# Patient Record
Sex: Male | Born: 1950
Health system: Southern US, Community
[De-identification: ages and names within clinical notes are randomized; demographics above are authoritative.]

## PROBLEM LIST (undated history)

## (undated) DIAGNOSIS — I10 Essential (primary) hypertension: Secondary | ICD-10-CM

## (undated) DIAGNOSIS — C801 Malignant (primary) neoplasm, unspecified: Secondary | ICD-10-CM

## (undated) HISTORY — PX: NO PAST SURGERIES: SHX2092

---

## 1998-04-28 ENCOUNTER — Ambulatory Visit (HOSPITAL_COMMUNITY): Admission: RE | Admit: 1998-04-28 | Discharge: 1998-04-28 | Payer: Self-pay | Admitting: Internal Medicine

## 2000-02-10 ENCOUNTER — Encounter: Payer: Self-pay | Admitting: Internal Medicine

## 2000-02-10 ENCOUNTER — Encounter: Admission: RE | Admit: 2000-02-10 | Discharge: 2000-02-10 | Payer: Self-pay | Admitting: Internal Medicine

## 2001-07-17 ENCOUNTER — Emergency Department (HOSPITAL_COMMUNITY): Admission: EM | Admit: 2001-07-17 | Discharge: 2001-07-17 | Payer: Self-pay | Admitting: Emergency Medicine

## 2003-10-01 ENCOUNTER — Emergency Department (HOSPITAL_COMMUNITY): Admission: AD | Admit: 2003-10-01 | Discharge: 2003-10-01 | Payer: Self-pay | Admitting: Family Medicine

## 2004-08-29 ENCOUNTER — Emergency Department (HOSPITAL_COMMUNITY): Admission: EM | Admit: 2004-08-29 | Discharge: 2004-08-29 | Payer: Self-pay | Admitting: Family Medicine

## 2006-06-29 ENCOUNTER — Encounter: Admission: RE | Admit: 2006-06-29 | Discharge: 2006-06-29 | Payer: Self-pay | Admitting: Anesthesiology

## 2006-07-11 ENCOUNTER — Encounter: Admission: RE | Admit: 2006-07-11 | Discharge: 2006-10-09 | Payer: Self-pay | Admitting: Orthopedic Surgery

## 2007-09-23 ENCOUNTER — Emergency Department (HOSPITAL_COMMUNITY): Admission: EM | Admit: 2007-09-23 | Discharge: 2007-09-23 | Payer: Self-pay | Admitting: Emergency Medicine

## 2012-07-12 ENCOUNTER — Other Ambulatory Visit: Payer: Self-pay | Admitting: Urology

## 2012-08-23 ENCOUNTER — Encounter (HOSPITAL_COMMUNITY): Payer: Self-pay | Admitting: Pharmacy Technician

## 2012-08-27 ENCOUNTER — Encounter (HOSPITAL_COMMUNITY): Payer: Self-pay

## 2012-08-27 ENCOUNTER — Encounter (HOSPITAL_COMMUNITY)
Admission: RE | Admit: 2012-08-27 | Discharge: 2012-08-27 | Disposition: A | Discharge status: Home / Self Care | Payer: BC Managed Care – PPO | Source: Ambulatory Visit | Attending: Urology | Admitting: Urology

## 2012-08-27 ENCOUNTER — Ambulatory Visit (HOSPITAL_COMMUNITY)
Admission: RE | Admit: 2012-08-27 | Discharge: 2012-08-27 | Disposition: A | Discharge status: Home / Self Care | Payer: BC Managed Care – PPO | Source: Ambulatory Visit | Attending: Urology | Admitting: Urology

## 2012-08-27 DIAGNOSIS — Z01811 Encounter for preprocedural respiratory examination: Secondary | ICD-10-CM | POA: Insufficient documentation

## 2012-08-27 HISTORY — DX: Essential (primary) hypertension: I10

## 2012-08-27 HISTORY — DX: Malignant (primary) neoplasm, unspecified: C80.1

## 2012-08-27 LAB — BASIC METABOLIC PANEL
Chloride: 101 mEq/L (ref 96–112)
Creatinine, Ser: 1.03 mg/dL (ref 0.50–1.35)
GFR calc Af Amer: 89 mL/min — ABNORMAL LOW (ref >90)
GFR calc non Af Amer: 76 mL/min — ABNORMAL LOW (ref >90)
Potassium: 3.5 mEq/L (ref 3.5–5.1)

## 2012-08-27 LAB — SURGICAL PCR SCREEN: Staphylococcus aureus: NEGATIVE

## 2012-08-27 LAB — CBC
MCHC: 34.6 g/dL (ref 30.0–36.0)
Platelets: 245 10*3/uL (ref 150–400)
RDW: 13.1 % (ref 11.5–15.5)
WBC: 4.4 10*3/uL (ref 4.0–10.5)

## 2012-08-27 NOTE — Patient Instructions (Addendum)
20 Thomas Conway  08/27/2012   Your procedure is scheduled on:  08/29/12   Wednesday  Surgery [id number]  Report to [facility] at   0630    AM.  Call this number if you have problems the morning of surgery: [phone]       Remember:   Do not eat food AFTER MIDNIGHT TONIGHT,  BEGIN CLEAR LIQUIDS Tuesday WITH BOWL PREP AS PER OFFICE     INCREASE FLUIDS  ALL DAY Tuesday      NOTHING AFTER MIDNIGHT Tuesday NIGHT   Take these medicines the morning of surgery with A SIP OF WATER:  NONE   .  Contacts, dentures or partial plates can not be worn to surgery  Leave suitcase in the car. After surgery it may be brought to your room.  For patients admitted to the hospital, checkout time is 11:00 AM day of  discharge.             SPECIAL INSTRUCTIONS- SEE [place] PREPARING FOR SURGERY INSTRUCTION SHEET-     DO NOT WEAR JEWELRY, LOTIONS, POWDERS, OR PERFUMES.  WOMEN-- DO NOT SHAVE LEGS OR UNDERARMS FOR 12 HOURS BEFORE SHOWERS. MEN MAY SHAVE FACE.  Patients discharged the day of surgery will not be allowed to drive home. IF going home the day of surgery, you must have a driver and someone to stay with you for the first 24 hours  Name and phone number of your driver:      Debbora Dus    [phone]                                                                  Please read over the following fact sheets that you were given: MRSA Information, Incentive Spirometry Sheet, Blood Transfusion Sheet  Information                                                                                   [name]  PST [phone]

## 2012-08-28 NOTE — H&P (Signed)
Reason for admission: Patient is to undergo robotic-assisted laparoscopic prostatectomy for prostate cancer. He will be hopefully admitted for routine postoperative care.    Prostate cancer summary:        Referred  by Dr. Eula Listen for an elevated PSA of 5.64. PSA one year ago was 3.92. No family history of prostate cancer. No other Gu concerns or history. AUA score  4/2.   Digital rectal exam was within normal limits. The patient underwent a transrectal ultrasound of the prostate with biopsy approximately one week ago. He is tolerated that procedure well and only is complaining of some ongoing hematospermia. The patient biopsies unfortunately were positive. Four out of 6 biopsies were positive on the right and 5/6 were positive on the left. All biopsies showed Gleason's 3+3 equal 6 cancer. Core involvement was less than 5% to 70%. AUA score 4/2 SHIM 21/25   Past Medical History Problems  1. History of  Allergic Rhinitis 477.9 2. History of  Gastrointestinal Bleeding 578.9 3. History of  Heartburn 787.1 4. History of  Hypertension 401.9 5. History of  Tinea Cruris 110.3  Surgical History Problems  1. History of  Colonoscopy (Fiberoptic)  Current Meds 1. Allegra-D 24 Hour 180-240 MG TB24; Therapy: (Recorded:11Jun2013) to 2. Cialis 20 MG Oral Tablet; Therapy: 20Feb2013 to 3. Cyclobenzaprine HCl 10 MG Oral Tablet; Therapy: 29Apr2013 to 4. Fluticasone Propionate 50 MCG/ACT Nasal Suspension; Therapy: (Recorded:11Jun2013) to 5. Levofloxacin 500 MG Oral Tablet; TAKE 1 TABLET Daily Start taking the day prior to procedure  date; Therapy: 11Jun2013 to (Evaluate:14Jun2013); Last Rx:11Jun2013 6. Mobic 15 MG Oral Tablet; Therapy: (Recorded:11Jun2013) to 7. Valsartan-Hydrochlorothiazide 80-12.5 MG Oral Tablet; Therapy: 08May2012 to  Allergies Medication  1. No Known Drug Allergies  Family History Problems  1. Paternal history of  Death In The Family Father 50yrs, Leukemia 2. Maternal  history of  Death In The Family Mother 81yrs, ovarian ca 3. Family history of  Family Health Status Number Of Children 1 girl 4. Paternal history of  Leukemia V16.6 5. Maternal history of  Ovarian Cancer V16.41  Social History Problems  1. Caffeine Use 2. Former Smoker V15.82 quit in 2001 pack per yr=10 3. Marital History - Single 4. Occupation: Paramedic as a Forensic psychologist. History of  Alcohol Use  Review of Systems Genitourinary, constitutional, skin, eye, otolaryngeal, hematologic/lymphatic, cardiovascular, pulmonary, endocrine, musculoskeletal, gastrointestinal, neurological and psychiatric system(s) were reviewed and pertinent findings if present are noted.  Genitourinary: nocturia and erectile dysfunction.  Constitutional: night sweats.  ENT: sore throat and sinus problems.  Musculoskeletal: back pain and joint pain.    Vitals  Blood Pressure: 126 / 87 Temperature: 98.3 F Heart Rate: 67  Physical Exam Constitutional: Well nourished and well developed . No acute distress.  ENT:. The ears and nose are normal in appearance.  Neck: The appearance of the neck is normal and no neck mass is present.  Pulmonary: No respiratory distress and normal respiratory rhythm and effort.  Cardiovascular: Heart rate and rhythm are normal . No peripheral edema.  Abdomen: The abdomen is soft and nontender. No masses are palpated. No CVA tenderness. No hernias are palpable. No hepatosplenomegaly noted.  Rectal: Rectal exam demonstrates normal sphincter tone, no tenderness and no masses. Estimated prostate size is 2+. Normal rectal tone, no rectal masses, prostate is smooth, symmetric and non-tender. The prostate has no nodularity and is not tender. The left seminal vesicle is nonpalpable. The right seminal vesicle is nonpalpable. The perineum is normal on  inspection.  Genitourinary: Examination of the penis demonstrates no discharge, no masses, no lesions and a normal meatus.  The scrotum is without lesions. The right epididymis is palpably normal and non-tender. The left epididymis is palpably normal and non-tender. The right testis is non-tender and without masses. The left testis is non-tender and without masses.  Lymphatics: The femoral and inguinal nodes are not enlarged or tender.  Skin: Normal skin turgor, no visible rash and no visible skin lesions.  Neuro/Psych:. Mood and affect are appropriate.   Assessment Assessed  1. Prostate Cancer 185  Plan Prostate Cancer (185)  1. Follow-up Schedule Surgery Office  Follow-up  Done: 01Oct2013  Discussion/Summary  The patient was counseled about the natural history of prostate cancer and the standard treatment options that are available for prostate cancer. It was explained to him how his age and life expectancy, clinical stage, Gleason score, and PSA affect his prognosis, the decision to proceed with additional staging studies, as well as how that information influences recommended treatment strategies. We discussed the roles for active surveillance, radiation therapy, surgical therapy, androgen deprivation, as well as ablative therapy options for the treatment of prostate cancer as appropriate to his individual cancer situation. We discussed the risks and benefits of these options with regard to their impact on cancer control and also in terms of potential adverse events, complications, and impact on quiality of life particularly related to urinary, bowel, and sexual function. The patient was encouraged to ask questions throughout the discussion today and all questions were answered to his stated satisfaction. In addition, the patient was provided with and/or directed to appropriate resources and literature for further education about prostate cancer and treatment options.   We discussed surgical therapy for prostate cancer including the different available surgical approaches. We discussed, in detail, the risks and  expectations of surgery with regard to cancer control, urinary control, and erectile function as well as the expected postoperative recovery process. The risks, potential complications/adverse events of radical prostatectomy as well as alternative options were explained to the patient.   We discussed surgical therapy for prostate cancer including the different available surgical approaches. We discussed, in detail, the risks and expectations of surgery with regard to cancer control, urinary control, and erectile function as well as the expected postoperative recovery process. Additional risks of surgery including but not limited to bleeding, infection, hernia formation, nerve damage, lymphocele formation, bowel/rectal injury potentially necessitating colostomy, damage to the urinary tract resulting in urine leakage, urethral stricture, and the cardiopulmonary risks such as myocardial infarction, stroke, death, venothromboembolism, etc. were explained. The risk of open surgical conversion for robotic/laparoscopic prostatectomy was also discussed.   50 minutes were spent in face to face consultation with patient today.  A total of minutes were spent in the overall care of the patient today with minutes in direct face to face consultation.

## 2012-08-28 NOTE — Anesthesia Preprocedure Evaluation (Addendum)
Anesthesia Evaluation  Patient identified by MRN, date of birth, ID band Patient awake    Reviewed: Allergy & Precautions, H&P , NPO status , Patient's Chart, lab work & pertinent test results, reviewed documented beta blocker date and time   Airway Mallampati: II TM Distance: >3 FB Neck ROM: full    Dental No notable dental hx.    Pulmonary former smoker,  breath sounds clear to auscultation  Pulmonary exam normal       Cardiovascular Exercise Tolerance: Good hypertension, Pt. on medications negative cardio ROS  Rhythm:regular Rate:Normal  ECG and CXR normal   Neuro/Psych negative neurological ROS  negative psych ROS   GI/Hepatic negative GI ROS, Neg liver ROS,   Endo/Other  negative endocrine ROS  Renal/GU negative Renal ROS  negative genitourinary   Musculoskeletal   Abdominal   Peds  Hematology negative hematology ROS (+)   Anesthesia Other Findings   Reproductive/Obstetrics negative OB ROS                          Anesthesia Physical Anesthesia Plan  ASA: II  Anesthesia Plan: General ETT   Post-op Pain Management:    Induction: Intravenous  Airway Management Planned:   Additional Equipment:   Intra-op Plan:   Post-operative Plan: Extubation in OR  Informed Consent: I have reviewed the patients History and Physical, chart, labs and discussed the procedure including the risks, benefits and alternatives for the proposed anesthesia with the patient or authorized representative who has indicated his/her understanding and acceptance.   Dental Advisory Given  Plan Discussed with: CRNA  Anesthesia Plan Comments:        Anesthesia Quick Evaluation

## 2012-08-29 ENCOUNTER — Observation Stay (HOSPITAL_COMMUNITY)
Admission: RE | Admit: 2012-08-29 | Discharge: 2012-08-30 | Disposition: A | Discharge status: Home / Self Care | Payer: BC Managed Care – PPO | Source: Ambulatory Visit | Attending: Urology | Admitting: Urology

## 2012-08-29 ENCOUNTER — Encounter (HOSPITAL_COMMUNITY)
Admission: RE | Disposition: A | Discharge status: Home / Self Care | Payer: Self-pay | Source: Ambulatory Visit | Attending: Urology

## 2012-08-29 ENCOUNTER — Encounter (HOSPITAL_COMMUNITY): Payer: Self-pay

## 2012-08-29 ENCOUNTER — Encounter (HOSPITAL_COMMUNITY): Payer: Self-pay | Admitting: Anesthesiology

## 2012-08-29 ENCOUNTER — Ambulatory Visit (HOSPITAL_COMMUNITY): Payer: BC Managed Care – PPO | Admitting: Anesthesiology

## 2012-08-29 DIAGNOSIS — Z79899 Other long term (current) drug therapy: Secondary | ICD-10-CM | POA: Insufficient documentation

## 2012-08-29 DIAGNOSIS — C61 Malignant neoplasm of prostate: Principal | ICD-10-CM | POA: Insufficient documentation

## 2012-08-29 DIAGNOSIS — I10 Essential (primary) hypertension: Secondary | ICD-10-CM | POA: Insufficient documentation

## 2012-08-29 HISTORY — PX: LYMPHADENECTOMY: SHX5960

## 2012-08-29 HISTORY — PX: ROBOT ASSISTED LAPAROSCOPIC RADICAL PROSTATECTOMY: SHX5141

## 2012-08-29 LAB — HEMOGLOBIN AND HEMATOCRIT, BLOOD: HCT: 36.4 % — ABNORMAL LOW (ref 39.0–52.0)

## 2012-08-29 LAB — TYPE AND SCREEN: ABO/RH(D): O POS

## 2012-08-29 LAB — ABO/RH: ABO/RH(D): O POS

## 2012-08-29 SURGERY — ROBOTIC ASSISTED LAPAROSCOPIC RADICAL PROSTATECTOMY
Anesthesia: General | Wound class: Clean Contaminated

## 2012-08-29 MED ORDER — HYDROMORPHONE HCL PF 1 MG/ML IJ SOLN
INTRAMUSCULAR | Status: DC | PRN
  Administered 2012-08-29 (×4): 0.5 mg via INTRAVENOUS

## 2012-08-29 MED ORDER — SODIUM CHLORIDE 0.9 % IR SOLN
Status: DC | PRN
  Administered 2012-08-29: 300 mL via INTRAVESICAL

## 2012-08-29 MED ORDER — HYDROMORPHONE HCL PF 1 MG/ML IJ SOLN: 0.2500 mg | INTRAMUSCULAR | Status: DC | PRN

## 2012-08-29 MED ORDER — BUPIVACAINE-EPINEPHRINE PF 0.25-1:200000 % IJ SOLN
INTRAMUSCULAR | Status: AC
  Filled 2012-08-29: qty 30

## 2012-08-29 MED ORDER — ACETAMINOPHEN 10 MG/ML IV SOLN
INTRAVENOUS | Status: DC | PRN
  Administered 2012-08-29: 1000 mg via INTRAVENOUS

## 2012-08-29 MED ORDER — HEMOSTATIC AGENTS (NO CHARGE) OPTIME
TOPICAL | Status: DC | PRN
  Administered 2012-08-29: 1 via TOPICAL

## 2012-08-29 MED ORDER — PROPOFOL 10 MG/ML IV EMUL
INTRAVENOUS | Status: DC | PRN
  Administered 2012-08-29: 200 mg via INTRAVENOUS

## 2012-08-29 MED ORDER — NEOSTIGMINE METHYLSULFATE 1 MG/ML IJ SOLN
INTRAMUSCULAR | Status: DC | PRN
  Administered 2012-08-29: 3 mg via INTRAVENOUS

## 2012-08-29 MED ORDER — MIDAZOLAM HCL 5 MG/5ML IJ SOLN
INTRAMUSCULAR | Status: DC | PRN
  Administered 2012-08-29: 1 mg via INTRAVENOUS

## 2012-08-29 MED ORDER — HYDROCODONE-ACETAMINOPHEN 5-325 MG PO TABS: 1.0000 | ORAL_TABLET | ORAL | Status: DC | PRN

## 2012-08-29 MED ORDER — INDIGOTINDISULFONATE SODIUM 8 MG/ML IJ SOLN
INTRAMUSCULAR | Status: DC | PRN
  Administered 2012-08-29 (×2): 5 mL via INTRAVENOUS

## 2012-08-29 MED ORDER — ONDANSETRON HCL 4 MG/2ML IJ SOLN
INTRAMUSCULAR | Status: DC | PRN
  Administered 2012-08-29 (×2): 2 mg via INTRAVENOUS

## 2012-08-29 MED ORDER — PROMETHAZINE HCL 25 MG/ML IJ SOLN: 6.2500 mg | INTRAMUSCULAR | Status: DC | PRN

## 2012-08-29 MED ORDER — LIDOCAINE HCL (CARDIAC) 20 MG/ML IV SOLN
INTRAVENOUS | Status: DC | PRN
  Administered 2012-08-29: 20 mg via INTRAVENOUS

## 2012-08-29 MED ORDER — SODIUM CHLORIDE 0.9 % IV BOLUS (SEPSIS)
1000.0000 mL | Freq: Once | INTRAVENOUS | Status: AC
  Administered 2012-08-29: 1000 mL via INTRAVENOUS

## 2012-08-29 MED ORDER — ACETAMINOPHEN 10 MG/ML IV SOLN
1000.0000 mg | Freq: Four times a day (QID) | INTRAVENOUS | Status: AC
  Administered 2012-08-29 – 2012-08-30 (×4): 1000 mg via INTRAVENOUS
  Filled 2012-08-29 (×4): qty 100

## 2012-08-29 MED ORDER — VALSARTAN-HYDROCHLOROTHIAZIDE 80-12.5 MG PO TABS: 1.0000 | ORAL_TABLET | Freq: Every morning | ORAL | Status: DC

## 2012-08-29 MED ORDER — FENTANYL CITRATE 0.05 MG/ML IJ SOLN
INTRAMUSCULAR | Status: DC | PRN
  Administered 2012-08-29 (×2): 25 ug via INTRAVENOUS
  Administered 2012-08-29 (×2): 50 ug via INTRAVENOUS
  Administered 2012-08-29 (×3): 25 ug via INTRAVENOUS
  Administered 2012-08-29 (×2): 50 ug via INTRAVENOUS
  Administered 2012-08-29: 75 ug via INTRAVENOUS
  Administered 2012-08-29 (×4): 25 ug via INTRAVENOUS

## 2012-08-29 MED ORDER — ACETAMINOPHEN 10 MG/ML IV SOLN
INTRAVENOUS | Status: AC
  Filled 2012-08-29: qty 100

## 2012-08-29 MED ORDER — MORPHINE SULFATE 2 MG/ML IJ SOLN
2.0000 mg | INTRAMUSCULAR | Status: DC | PRN
  Administered 2012-08-29: 2 mg via INTRAVENOUS
  Filled 2012-08-29: qty 1

## 2012-08-29 MED ORDER — EPHEDRINE SULFATE 50 MG/ML IJ SOLN
INTRAMUSCULAR | Status: DC | PRN
  Administered 2012-08-29 (×2): 5 mg via INTRAVENOUS
  Administered 2012-08-29 (×2): 10 mg via INTRAVENOUS

## 2012-08-29 MED ORDER — BUPIVACAINE-EPINEPHRINE 0.25% -1:200000 IJ SOLN
INTRAMUSCULAR | Status: DC | PRN
  Administered 2012-08-29: 25 mL

## 2012-08-29 MED ORDER — HYDROCHLOROTHIAZIDE 12.5 MG PO CAPS
12.5000 mg | ORAL_CAPSULE | Freq: Every day | ORAL | Status: DC
  Administered 2012-08-29 – 2012-08-30 (×2): 12.5 mg via ORAL
  Filled 2012-08-29 (×2): qty 1

## 2012-08-29 MED ORDER — CEFAZOLIN SODIUM-DEXTROSE 2-3 GM-% IV SOLR
2.0000 g | INTRAVENOUS | Status: AC
  Administered 2012-08-29: 2 g via INTRAVENOUS

## 2012-08-29 MED ORDER — GLYCOPYRROLATE 0.2 MG/ML IJ SOLN
INTRAMUSCULAR | Status: DC | PRN
  Administered 2012-08-29: 0.2 mg via INTRAVENOUS
  Administered 2012-08-29: 0.4 mg via INTRAVENOUS

## 2012-08-29 MED ORDER — HYDROCODONE-ACETAMINOPHEN 5-325 MG PO TABS: 1.0000 | ORAL_TABLET | Freq: Four times a day (QID) | ORAL | Status: DC | PRN

## 2012-08-29 MED ORDER — CEFAZOLIN SODIUM-DEXTROSE 2-3 GM-% IV SOLR
INTRAVENOUS | Status: AC
  Filled 2012-08-29: qty 50

## 2012-08-29 MED ORDER — LACTATED RINGERS IV SOLN
INTRAVENOUS | Status: DC | PRN
  Administered 2012-08-29 (×2): via INTRAVENOUS

## 2012-08-29 MED ORDER — LACTATED RINGERS IV SOLN
INTRAVENOUS | Status: DC | PRN
  Administered 2012-08-29: 10:00:00

## 2012-08-29 MED ORDER — STERILE WATER FOR IRRIGATION IR SOLN
Status: DC | PRN
  Administered 2012-08-29: 1500 mL

## 2012-08-29 MED ORDER — CIPROFLOXACIN HCL 500 MG PO TABS: 500.0000 mg | ORAL_TABLET | Freq: Two times a day (BID) | ORAL | Status: DC

## 2012-08-29 MED ORDER — INDIGOTINDISULFONATE SODIUM 8 MG/ML IJ SOLN
INTRAMUSCULAR | Status: AC
  Filled 2012-08-29: qty 10

## 2012-08-29 MED ORDER — SUCCINYLCHOLINE CHLORIDE 20 MG/ML IJ SOLN
INTRAMUSCULAR | Status: DC | PRN
  Administered 2012-08-29: 100 mg via INTRAVENOUS

## 2012-08-29 MED ORDER — IRBESARTAN 75 MG PO TABS
75.0000 mg | ORAL_TABLET | Freq: Every day | ORAL | Status: DC
  Administered 2012-08-29 – 2012-08-30 (×2): 75 mg via ORAL
  Filled 2012-08-29 (×2): qty 1

## 2012-08-29 MED ORDER — CISATRACURIUM BESYLATE (PF) 10 MG/5ML IV SOLN
INTRAVENOUS | Status: DC | PRN
  Administered 2012-08-29: 10 mg via INTRAVENOUS
  Administered 2012-08-29: 2 mg via INTRAVENOUS
  Administered 2012-08-29: 1 mg via INTRAVENOUS
  Administered 2012-08-29 (×2): 2 mg via INTRAVENOUS
  Administered 2012-08-29: 1 mg via INTRAVENOUS
  Administered 2012-08-29: 2 mg via INTRAVENOUS
  Administered 2012-08-29: 1 mg via INTRAVENOUS
  Administered 2012-08-29: 2 mg via INTRAVENOUS
  Administered 2012-08-29: 1 mg via INTRAVENOUS
  Administered 2012-08-29: 2 mg via INTRAVENOUS

## 2012-08-29 MED ORDER — KCL IN DEXTROSE-NACL 10-5-0.45 MEQ/L-%-% IV SOLN
INTRAVENOUS | Status: DC
  Administered 2012-08-29 – 2012-08-30 (×2): via INTRAVENOUS
  Filled 2012-08-29 (×5): qty 1000

## 2012-08-29 MED ORDER — HEPARIN SODIUM (PORCINE) 1000 UNIT/ML IJ SOLN
INTRAMUSCULAR | Status: AC
  Filled 2012-08-29: qty 1

## 2012-08-29 MED ORDER — ESMOLOL HCL 10 MG/ML IV SOLN
INTRAVENOUS | Status: DC | PRN
  Administered 2012-08-29: 20 mg via INTRAVENOUS

## 2012-08-29 SURGICAL SUPPLY — 49 items
APPLICATOR SURGIFLO ENDO (HEMOSTASIS) ×3 IMPLANT
CANISTER SUCTION 2500CC (MISCELLANEOUS) ×3 IMPLANT
CATH FOLEY 2WAY SLVR  5CC 20FR (CATHETERS)
CATH FOLEY 2WAY SLVR 18FR 30CC (CATHETERS) ×3 IMPLANT
CATH FOLEY 2WAY SLVR 5CC 20FR (CATHETERS) IMPLANT
CATH ROBINSON RED A/P 16FR (CATHETERS) ×3 IMPLANT
CATH ROBINSON RED A/P 8FR (CATHETERS) ×3 IMPLANT
CATH TIEMANN FOLEY 18FR 5CC (CATHETERS) ×3 IMPLANT
CHLORAPREP W/TINT 26ML (MISCELLANEOUS) ×3 IMPLANT
CLIP LIGATING HEM O LOK PURPLE (MISCELLANEOUS) ×3 IMPLANT
CLOTH BEACON ORANGE TIMEOUT ST (SAFETY) ×3 IMPLANT
CORD HIGH FREQUENCY UNIPOLAR (ELECTROSURGICAL) ×3 IMPLANT
COVER SURGICAL LIGHT HANDLE (MISCELLANEOUS) ×3 IMPLANT
COVER TIP SHEARS 8 DVNC (MISCELLANEOUS) ×2 IMPLANT
COVER TIP SHEARS 8MM DA VINCI (MISCELLANEOUS) ×1
CUTTER ECHEON FLEX ENDO 45 340 (ENDOMECHANICALS) ×3 IMPLANT
DECANTER SPIKE VIAL GLASS SM (MISCELLANEOUS) ×3 IMPLANT
DRAPE SURG IRRIG POUCH 19X23 (DRAPES) ×3 IMPLANT
DRAPE UTILITY 15X26 (DRAPE) IMPLANT
DRSG TEGADERM 2-3/8X2-3/4 SM (GAUZE/BANDAGES/DRESSINGS) ×15 IMPLANT
DRSG TEGADERM 4X4.75 (GAUZE/BANDAGES/DRESSINGS) ×3 IMPLANT
DRSG TEGADERM 6X8 (GAUZE/BANDAGES/DRESSINGS) ×6 IMPLANT
ELECT REM PT RETURN 9FT ADLT (ELECTROSURGICAL) ×3
ELECTRODE REM PT RTRN 9FT ADLT (ELECTROSURGICAL) ×2 IMPLANT
FLOSEAL 10ML (HEMOSTASIS) ×3 IMPLANT
GLOVE BIO SURGEON STRL SZ 6.5 (GLOVE) ×6 IMPLANT
GLOVE BIOGEL M STRL SZ7.5 (GLOVE) ×6 IMPLANT
GOWN PREVENTION PLUS XLARGE (GOWN DISPOSABLE) ×3 IMPLANT
GOWN STRL NON-REIN LRG LVL3 (GOWN DISPOSABLE) ×3 IMPLANT
GOWN STRL REIN XL XLG (GOWN DISPOSABLE) ×6 IMPLANT
HEMOSTAT SURGICEL 2X14 (HEMOSTASIS) ×3 IMPLANT
HOLDER FOLEY CATH W/STRAP (MISCELLANEOUS) ×3 IMPLANT
IV LACTATED RINGERS 1000ML (IV SOLUTION) ×3 IMPLANT
KIT ACCESSORY DA VINCI DISP (KITS) ×1
KIT ACCESSORY DVNC DISP (KITS) ×2 IMPLANT
NDL SAFETY ECLIPSE 18X1.5 (NEEDLE) ×2 IMPLANT
NEEDLE HYPO 18GX1.5 SHARP (NEEDLE) ×1
PACK ROBOT UROLOGY CUSTOM (CUSTOM PROCEDURE TRAY) ×3 IMPLANT
RELOAD GREEN ECHELON 45 (STAPLE) ×3 IMPLANT
SEALER TISSUE G2 CVD JAW 45CM (ENDOMECHANICALS) ×3 IMPLANT
SET TUBE IRRIG SUCTION NO TIP (IRRIGATION / IRRIGATOR) ×3 IMPLANT
SOLUTION ELECTROLUBE (MISCELLANEOUS) ×3 IMPLANT
SPONGE GAUZE 4X4 12PLY (GAUZE/BANDAGES/DRESSINGS) ×3 IMPLANT
SUT VIC AB 2-0 SH 27 (SUTURE) ×3
SUT VIC AB 2-0 SH 27X BRD (SUTURE) ×6 IMPLANT
SUT VICRYL 0 UR6 27IN ABS (SUTURE) ×6 IMPLANT
SYR 27GX1/2 1ML LL SAFETY (SYRINGE) ×3 IMPLANT
TOWEL OR NON WOVEN STRL DISP B (DISPOSABLE) ×3 IMPLANT
WATER STERILE IRR 1500ML POUR (IV SOLUTION) ×9 IMPLANT

## 2012-08-29 NOTE — Transfer of Care (Signed)
Immediate Anesthesia Transfer of Care Note  Patient: Thomas Conway  Procedure(s) Performed: Procedure(s) (LRB) with comments: ROBOTIC ASSISTED LAPAROSCOPIC RADICAL PROSTATECTOMY (N/A) LYMPHADENECTOMY ()  Patient Location: PACU  Anesthesia Type:General  Level of Consciousness: awake, alert  and patient cooperative  Airway & Oxygen Therapy: Patient Spontanous Breathing and Patient connected to face mask oxygen  Post-op Assessment: Report given to PACU RN, Post -op Vital signs reviewed and stable and Patient moving all extremities  Post vital signs: Reviewed and stable  Complications: No apparent anesthesia complications

## 2012-08-29 NOTE — Progress Notes (Signed)
Day of Surgery Subjective: Patient reports pain control good.  Denies N/V.  Has ambulated x 1  Objective: Vital signs in last 24 hours: Temp:  [97.3 F (36.3 C)-98.2 F (36.8 C)] 97.3 F (36.3 C) (11/20 1330) Pulse Rate:  [76-101] 94  (11/20 1330) Resp:  [9-20] 20  (11/20 1330) BP: (126-175)/(75-86) 152/75 mmHg (11/20 1330) SpO2:  [97 %-100 %] 98 % (11/20 1330) Weight:  [101.606 kg (224 lb)] 101.606 kg (224 lb) (11/20 1330)  Intake/Output from previous day:   Intake/Output this shift: Total I/O In: 1720 [I.V.:1550; Other:70; IV Piggyback:100] Out: 260 [Urine:75; Drains:10; Blood:175]  Physical Exam:  General:alert, cooperative and no distress Cardiovascular: RRR Lungs: unlabored GI: soft Incisions: dressings intact Urine: blue Extremities: SCDs in place  Lab Results:  Basename 08/29/12 1245 08/27/12 0840  HGB 12.4* 13.9  HCT 36.4* 40.2   BMET  Basename 08/27/12 0840  NA 137  K 3.5  CL 101  CO2 25  GLUCOSE 93  BUN 16  CREATININE 1.03  CALCIUM 9.1   No results found for this basename: LABPT:3,INR:3 in the last 72 hours No results found for this basename: LABURIN:1 in the last 72 hours Results for orders placed during the hospital encounter of 08/27/12  SURGICAL PCR SCREEN     Status: Normal   Collection Time   08/27/12  7:53 AM      Component Value Range Status Comment   MRSA, PCR NEGATIVE  NEGATIVE Final    Staphylococcus aureus NEGATIVE  NEGATIVE Final     Studies/Results: No results found.  Assessment/Plan: Day of Surgery, Procedure(s) (LRB): ROBOTIC ASSISTED LAPAROSCOPIC RADICAL PROSTATECTOMY (N/A) LYMPHADENECTOMY ()  Continue to monitor IVF Ambulate, Incentive spirometry DVT prophylaxis   LOS: 0 days   Thomas Conway. 08/29/2012, 6:14 PM

## 2012-08-29 NOTE — Anesthesia Postprocedure Evaluation (Signed)
  Anesthesia Post-op Note  Patient: Thomas Conway  Procedure(s) Performed: Procedure(s) (LRB): ROBOTIC ASSISTED LAPAROSCOPIC RADICAL PROSTATECTOMY (N/A) LYMPHADENECTOMY ()  Patient Location: PACU  Anesthesia Type: General  Level of Consciousness: awake and alert   Airway and Oxygen Therapy: Patient Spontanous Breathing  Post-op Pain: mild  Post-op Assessment: Post-op Vital signs reviewed, Patient's Cardiovascular Status Stable, Respiratory Function Stable, Patent Airway and No signs of Nausea or vomiting  Last Vitals:  Filed Vitals:   08/29/12 1330  BP: 152/75  Pulse: 94  Temp: 36.3 C  Resp: 20    Post-op Vital Signs: stable   Complications: No apparent anesthesia complications

## 2012-08-30 LAB — BASIC METABOLIC PANEL
CO2: 26 mEq/L (ref 19–32)
Calcium: 8.1 mg/dL — ABNORMAL LOW (ref 8.4–10.5)
Creatinine, Ser: 1.01 mg/dL (ref 0.50–1.35)
GFR calc Af Amer: >90 mL/min (ref >90)

## 2012-08-30 LAB — HEMOGLOBIN AND HEMATOCRIT, BLOOD: Hemoglobin: 11.9 g/dL — ABNORMAL LOW (ref 13.0–17.0)

## 2012-08-30 MED ORDER — HYDROCODONE-ACETAMINOPHEN 5-325 MG PO TABS: 1.0000 | ORAL_TABLET | Freq: Four times a day (QID) | ORAL | Status: DC | PRN

## 2012-08-30 MED ORDER — CIPROFLOXACIN HCL 500 MG PO TABS: 500.0000 mg | ORAL_TABLET | Freq: Two times a day (BID) | ORAL | Status: DC

## 2012-08-30 MED ORDER — BISACODYL 10 MG RE SUPP
10.0000 mg | Freq: Once | RECTAL | Status: AC
  Administered 2012-08-30: 10 mg via RECTAL
  Filled 2012-08-30: qty 1

## 2012-08-30 NOTE — Discharge Summary (Signed)
  Date of admission: 08/29/2012  Date of discharge: 08/30/2012  Admission diagnosis: Prostate Cancer  Discharge diagnosis: Prostate Cancer  History and Physical: For full details, please see admission history and physical. Briefly, [name] is a 61 y.o. gentleman with localized prostate cancer.  After discussing management/treatment options, he elected to proceed with surgical treatment.  Hospital Course: QADIR KAPPLE was taken to the operating room on 08/29/2012 and underwent a robotic assisted laparoscopic radical prostatectomy. He tolerated this procedure well and without complications. Postoperatively, he was able to be transferred to a regular hospital room following recovery from anesthesia.  He was able to begin ambulating the night of surgery. He remained hemodynamically stable overnight.  He had excellent urine output with appropriately minimal output from his pelvic drain and his pelvic drain was removed on POD #1.  He was transitioned to oral pain medication, tolerated a clear liquid diet, and had met all discharge criteria and was able to be discharged home later on POD#1.  Laboratory values:  Basename 08/30/12 0922 08/30/12 0454 08/29/12 1245  HGB 12.2* 11.9* 12.4*  HCT 35.0* 35.0* 36.4*    Disposition: Home  Discharge instruction: He was instructed to be ambulatory but to refrain from heavy lifting, strenuous activity, or driving. He was instructed on urethral catheter care.  Discharge medications:     Medication List     As of 08/30/2012  2:27 PM    START taking these medications         ciprofloxacin 500 MG tablet   Commonly known as: CIPRO   Take 1 tablet (500 mg total) by mouth 2 (two) times daily. Start day prior to office visit for foley removal      HYDROcodone-acetaminophen 5-325 MG per tablet   Commonly known as: NORCO/VICODIN   Take 1-2 tablets by mouth every 6 (six) hours as needed for pain.      CONTINUE taking these medications           valsartan-hydrochlorothiazide 80-12.5 MG per tablet   Commonly known as: DIOVAN-HCT          Where to get your medications    These are the prescriptions that you need to pick up.   You may get these medications from any pharmacy.         ciprofloxacin 500 MG tablet   HYDROcodone-acetaminophen 5-325 MG per tablet            Followup: He will followup in 1 week for catheter removal and to discuss his surgical pathology results.

## 2012-08-30 NOTE — Progress Notes (Signed)
1 Day Post-Op Subjective: Patient reports good pain control.  Denies N/V.  Has amb.   Objective: Vital signs in last 24 hours: Temp:  [97.3 F (36.3 C)-99.1 F (37.3 C)] 99.1 F (37.3 C) (11/21 0525) Pulse Rate:  [67-101] 67  (11/21 0525) Resp:  [9-20] 16  (11/21 0525) BP: (129-175)/(70-86) 141/75 mmHg (11/21 0525) SpO2:  [98 %-100 %] 98 % (11/21 0525) Weight:  [101.606 kg (224 lb)] 101.606 kg (224 lb) (11/20 1330)  Intake/Output from previous day: 11/20 0701 - 11/21 0700 In: 3854.6 [P.O.:240; I.V.:3414.6; IV Piggyback:200] Out: 2515 [Urine:2100; Drains:240; Blood:175] Intake/Output this shift:    Physical Exam:  General:alert, cooperative and no distress Cardiovascular: RRR Lungs:CTA  GI: soft, appropriately tender, normal bowel sounds Incisions: dressings intact Urine: clear/yellow Extremities: warm with SCDs  Lab Results:  Basename 08/30/12 0454 08/29/12 1245 08/27/12 0840  HGB 11.9* 12.4* 13.9  HCT 35.0* 36.4* 40.2   BMET  Basename 08/30/12 0454 08/27/12 0840  NA 135 137  K 3.6 3.5  CL 101 101  CO2 26 25  GLUCOSE 120* 93  BUN 10 16  CREATININE 1.01 1.03  CALCIUM 8.1* 9.1   No results found for this basename: LABPT:3,INR:3 in the last 72 hours No results found for this basename: LABURIN:1 in the last 72 hours Results for orders placed during the hospital encounter of 08/27/12  SURGICAL PCR SCREEN     Status: Normal   Collection Time   08/27/12  7:53 AM      Component Value Range Status Comment   MRSA, PCR NEGATIVE  NEGATIVE Final    Staphylococcus aureus NEGATIVE  NEGATIVE Final     Studies/Results: No results found.  Assessment/Plan: 1 Day Post-Op, Procedure(s) (LRB): ROBOTIC ASSISTED LAPAROSCOPIC RADICAL PROSTATECTOMY (N/A) LYMPHADENECTOMY ()  Ambulate, Incentive spirometry DVT prophylaxis Transition to PO pain medications SL IVF Follow drainage outpt.  Has decreased overnight but still approx 20-30cc in drain this am.  Poss d/c later  today Recheck H/H at 11.  Hg down 2g post op.   Dulcolax Poss home later today   LOS: 1 day   YARBROUGH,Denali Becvar G. 08/30/2012, 7:26 AM

## 2012-08-30 NOTE — Op Note (Signed)
Preoperative diagnosis: Clinical stage T1c Adenocarcinoma prostate  Postoperative diagnosis: Same  Procedure: Robotic-assisted laparoscopic radical retropubic prostatectomy with bilateral pelvic lymph node dissection  Surgeon: Valetta Fuller, MD  Asst.: Pecola Leisure, PA Anesthesia: Gen. Endotracheal  Indications: Patient was diagnosed with clinical stage TIc Adenocarcinoma the prostate. He underwent extensive consultation with regard to treatment options. The patient decided on a surgical approach. He appeared to understand the distinct advantages as well as the disadvantages of this procedure. The patient has performed a mechanical bowel prep. He has had placement of PAS compression boots and has received perioperative antibiotics. The patient's preoperative PSA was in the 5-6 range. Ultrasound revealed a 32 g prostate.   Technique and findings:The patient was brought to the operating room and had successful induction of general endotracheal anesthesia.the patient was placed in a low lithotomy position with careful padding of all extremities. He was secured to the operative table and placed in the steep Trendelenburg position. He was prepped and draped in usual manner. A Foley catheter was placed sterilely on the field. Camera port site was chosen 18 cm above the pubic symphysis just to the left of the umbilicus. A standard open Hassan technique was utilized. A 12 mm trocar was placed without difficulty. The camera was then inserted and no abnormalities were noted within the pelvis. The trochars were placed with direct visual guidance. This included 3 8mm robotic trochars and a 12 mm and 5 mm assist ports. Once all the ports were placed the robot was docked. The bladder was filled and the space of Retzius was developed with electrocautery dissection as well as blunt dissection. Superficial fat off the endopelvic fascia and bladder neck was removed with electrocautery scissors. The endopelvic fascia was  then incised bilaterally from base to apex. Levator musculature was swept off the apex of the prostate isolating the dorsal venous complex which was then stapled with the ETS stapling device. The anterior bladder neck was identified with the aid of the Foley balloon. This was then transected down to the Foley catheter with electrocautery scissors. The Foley catheter was then retracted anteriorly. Indigo carmine was given and we appeared to be well away from the ureteral orifices. The posterior bladder neck was then transected and the dissection carried down to the adnexal structures. The seminal vesicles and vas deferens on both sides were then individually dissected free and retracted anteriorly. The posterior plane between the rectum and prostate was then established primarily with blunt dissection.  Attention was then turned towards nerve sparing. The patient was felt to be a candidate for bilateral nerve sparing. Superficial fascia along the anterior lateral aspect of the prostate was incised bilaterally. This tissue was then swept laterally until we were able to establish a groove between the neurovascular tissue and the posterior lateral aspect on the prostate bilaterally. This groove was then extended from the apex back to the base of the prostate. With the prostate retracted anteriorly the vascular pedicles of the prostate were taken with the Enseal device. The Foley catheter was then reinserted and the anterior urethra was transected. The posterior urethra was then transected as were some rectourethralis fibers. The prostate was then removed from the pelvis. The pelvis was then copiously irrigated. Rectal insufflation was performed and there was no evidence of rectal injury.  Attention was then turned towards bilateral pelvic lymph node dissection. The obturator node packets were removed I laterally and the dissection extended towards the bifurcation of the iliac artery. The obturator nerve was  identified  on both sides and preserved. Hemalock clips were used for small veins and lymphatic channels. The node packets were sent for permanent analysis.  Attention was then turned towards reconstruction. The bladder neck was closed at the 3 and 9 oclock positions with Vicryl sututre. The bladder neck and posterior urethra were reapproximated at the 6:00 position utilizing a 2-0 Vicryl suture. The rest of the anastomosis was done with a double-armed 3-0 Monocryl suture in a 360 degree manner. Additional indigo carmine was given. A new catheter was placed and bladder irrigation revealed no evidence of leakage. A Blake drain was placed through one of the robotic trochars and positioned in the retropubic space above the anastomosis. This was then secured to the skin with a nylon suture. The prostate was placed in the Endopouch retrieval bag. The 12 mm trocar site was closed with a Vicryl suture with the aid of a suture passer. Our other trochars were taken out with direct visual guidance without evidence of any bleeding. The camera port incision was extended slightly to allow for removal of the specimen and then closed with a running Vicryl suture. All port sites were infiltrated with Marcaine and then closed with surgical clips. The patient was then taken to recovery room having had no obvious complications or problems. Sponge and needle counts were correct.

## 2012-08-30 NOTE — Progress Notes (Signed)
   CARE MANAGEMENT NOTE 08/30/2012  Patient:  Thomas Conway, Thomas Conway   Account Number:  [id number]  Date Initiated:  08/30/2012  Documentation initiated by:  Jiles Crocker  Subjective/Objective Assessment:   ADMITTED FOR SURGERY - Robotic-assisted laparoscopic radical retropubic prostatectomy with bilateral pelvic lymph node dissection     Action/Plan:   LIVES AT HOME ALONE; CM FOLLOWING FOR POSSIBLE DC NEEDS- IN SURGERY AT PRESENT   Anticipated DC Date:  08/31/2012   Anticipated DC Plan:  HOME/SELF CARE      DC Planning Services  CM consult          Status of service:  In process, will continue to follow Medicare Important Message given?  NA - LOS <3 / Initial given by admissions (If response is "NO", the following Medicare IM given date fields will be blank)  Per UR Regulation:  Reviewed for med. necessity/level of care/duration of stay  Comments:  08/30/2012- B Nakema Fake RN,BSN,MHA

## 2012-08-31 ENCOUNTER — Encounter (HOSPITAL_COMMUNITY): Payer: Self-pay | Admitting: Urology

## 2015-03-04 ENCOUNTER — Emergency Department (HOSPITAL_COMMUNITY)
Admission: EM | Admit: 2015-03-04 | Discharge: 2015-03-04 | Disposition: A | Discharge status: Home / Self Care | Payer: Self-pay | Source: Home / Self Care | Attending: Family Medicine | Admitting: Family Medicine

## 2015-03-04 ENCOUNTER — Encounter (HOSPITAL_COMMUNITY): Payer: Self-pay | Admitting: *Deleted

## 2015-03-04 DIAGNOSIS — J40 Bronchitis, not specified as acute or chronic: Secondary | ICD-10-CM

## 2015-03-04 MED ORDER — PREDNISONE 5 MG PO TABS: 30.0000 mg | ORAL_TABLET | Freq: Every day | ORAL | Status: AC

## 2015-03-04 MED ORDER — TRAMADOL HCL 50 MG PO TABS: 50.0000 mg | ORAL_TABLET | Freq: Every evening | ORAL | Status: AC | PRN

## 2015-03-04 MED ORDER — IPRATROPIUM-ALBUTEROL 0.5-2.5 (3) MG/3ML IN SOLN
RESPIRATORY_TRACT | Status: AC
  Filled 2015-03-04: qty 3

## 2015-03-04 MED ORDER — IPRATROPIUM-ALBUTEROL 0.5-2.5 (3) MG/3ML IN SOLN
3.0000 mL | Freq: Once | RESPIRATORY_TRACT | Status: AC
  Administered 2015-03-04: 3 mL via RESPIRATORY_TRACT

## 2015-03-04 MED ORDER — ALBUTEROL SULFATE HFA 108 (90 BASE) MCG/ACT IN AERS
INHALATION_SPRAY | RESPIRATORY_TRACT | Status: AC
  Filled 2015-03-04: qty 6.7

## 2015-03-04 MED ORDER — ALBUTEROL SULFATE HFA 108 (90 BASE) MCG/ACT IN AERS
1.0000 | INHALATION_SPRAY | Freq: Once | RESPIRATORY_TRACT | Status: AC
  Administered 2015-03-04: 1 via RESPIRATORY_TRACT

## 2015-03-04 NOTE — ED Notes (Signed)
Pt is here with complaints of non-productive cough and runny nose X 1 week.

## 2015-03-04 NOTE — ED Provider Notes (Signed)
Thomas Conway is a 64 y.o. male who presents to Urgent Care today for cough congestion wheezing runny nose. Symptoms present for one week. No fevers chills nausea vomiting or diarrhea. He's tried some over-the-counter medicines which helps a little. Feels well otherwise. He is a former smoker with a 20-pack-year history. He quit 1988   Past Medical History  Diagnosis Date  . Hypertension   . Cancer     prostate   Past Surgical History  Procedure Laterality Date  . No past surgeries    . Robot assisted laparoscopic radical prostatectomy  08/29/2012    Procedure: ROBOTIC ASSISTED LAPAROSCOPIC RADICAL PROSTATECTOMY;  Surgeon: Bernestine Amass, MD;  Location: [facility];  Service: Urology;  Laterality: N/A;  . Lymphadenectomy  08/29/2012    Procedure: LYMPHADENECTOMY;  Surgeon: Bernestine Amass, MD;  Location: [facility];  Service: Urology;;   History  Substance Use Topics  . Smoking status: Former Smoker -- 1.00 packs/day for 20 years    Types: Cigarettes    Quit date: 08/28/1987  . Smokeless tobacco: Never Used  . Alcohol Use: Yes     Comment: occasional   ROS as above Medications: No current facility-administered medications for this encounter.   Current Outpatient Prescriptions  Medication Sig Dispense Refill  . ciprofloxacin (CIPRO) 500 MG tablet Take 1 tablet (500 mg total) by mouth 2 (two) times daily. Start day prior to office visit for foley removal 6 tablet 0  . HYDROcodone-acetaminophen (NORCO) 5-325 MG per tablet Take 1-2 tablets by mouth every 6 (six) hours as needed for pain. 30 tablet 0  . valsartan-hydrochlorothiazide (DIOVAN-HCT) 80-12.5 MG per tablet Take 1 tablet by mouth every morning.     No Known Allergies   Exam:  BP 116/77 mmHg  Pulse 80  Temp(Src) 98.7 F (37.1 C) (Oral)  Resp 16  SpO2 100% Gen: Well NAD HEENT: EOMI,  MMM normal posterior pharynx and tympanic membranes. Clear nasal discharge. Lungs: Normal work of breathing. Wheezing present  bilaterally Heart: RRR no MRG Abd: NABS, Soft. Nondistended, Nontender Exts: Brisk capillary refill, warm and well perfused.   Patient was given a 2.5/0.5 mg DuoNeb nebulizer treatment, and felt better  No results found for this or any previous visit (from the past 24 hour(s)). No results found.  Assessment and Plan: 64 y.o. male with bronchitis. Treat with prednisone and albuterol and tramadol for cough suppression.  Discussed warning signs or symptoms. Please see discharge instructions. Patient expresses understanding.     Gregor Hams, MD 03/04/15 [phone]

## 2015-03-04 NOTE — Discharge Instructions (Signed)
Thank you for coming in today. Call or go to the emergency room if you get worse, have trouble breathing, have chest pains, or palpitations.  Use the albuterol inhaler every 4-6 hours as needed for wheezing or shortness of breath or persistent cough. Take prednisone daily. This medicine is $4 at [facility]. Use tramadol for severe cough. Do not drive after taking this medication, as it will make you somewhat sleepy. Acute Bronchitis Bronchitis is inflammation of the airways that extend from the windpipe into the lungs (bronchi). The inflammation often causes mucus to develop. This leads to a cough, which is the most common symptom of bronchitis.  In acute bronchitis, the condition usually develops suddenly and goes away over time, usually in a couple weeks. Smoking, allergies, and asthma can make bronchitis worse. Repeated episodes of bronchitis may cause further lung problems.  CAUSES Acute bronchitis is most often caused by the same virus that causes a cold. The virus can spread from person to person (contagious) through coughing, sneezing, and touching contaminated objects. SIGNS AND SYMPTOMS   Cough.   Fever.   Coughing up mucus.   Body aches.   Chest congestion.   Chills.   Shortness of breath.   Sore throat.  DIAGNOSIS  Acute bronchitis is usually diagnosed through a physical exam. Your health care provider will also ask you questions about your medical history. Tests, such as chest X-rays, are sometimes done to rule out other conditions.  TREATMENT  Acute bronchitis usually goes away in a couple weeks. Oftentimes, no medical treatment is necessary. Medicines are sometimes given for relief of fever or cough. Antibiotic medicines are usually not needed but may be prescribed in certain situations. In some cases, an inhaler may be recommended to help reduce shortness of breath and control the cough. A cool mist vaporizer may also be used to help thin bronchial secretions and  make it easier to clear the chest.  HOME CARE INSTRUCTIONS  Get plenty of rest.   Drink enough fluids to keep your urine clear or pale yellow (unless you have a medical condition that requires fluid restriction). Increasing fluids may help thin your respiratory secretions (sputum) and reduce chest congestion, and it will prevent dehydration.   Take medicines only as directed by your health care provider.  If you were prescribed an antibiotic medicine, finish it all even if you start to feel better.  Avoid smoking and secondhand smoke. Exposure to cigarette smoke or irritating chemicals will make bronchitis worse. If you are a smoker, consider using nicotine gum or skin patches to help control withdrawal symptoms. Quitting smoking will help your lungs heal faster.   Reduce the chances of another bout of acute bronchitis by washing your hands frequently, avoiding people with cold symptoms, and trying not to touch your hands to your mouth, nose, or eyes.   Keep all follow-up visits as directed by your health care provider.  SEEK MEDICAL CARE IF: Your symptoms do not improve after 1 week of treatment.  SEEK IMMEDIATE MEDICAL CARE IF:  You develop an increased fever or chills.   You have chest pain.   You have severe shortness of breath.  You have bloody sputum.   You develop dehydration.  You faint or repeatedly feel like you are going to pass out.  You develop repeated vomiting.  You develop a severe headache. MAKE SURE YOU:   Understand these instructions.  Will watch your condition.  Will get help right away if you are not doing  well or get worse. Document Released: 11/03/2004 Document Revised: 02/10/2014 Document Reviewed: 03/19/2013 [facility] Patient Information 2015 [place], [place]. This information is not intended to replace advice given to you by your health care provider. Make sure you discuss any questions you have with your health care provider.

## 2016-01-01 DIAGNOSIS — J069 Acute upper respiratory infection, unspecified: Secondary | ICD-10-CM | POA: Diagnosis not present

## 2016-01-01 DIAGNOSIS — J309 Allergic rhinitis, unspecified: Secondary | ICD-10-CM | POA: Diagnosis not present

## 2016-01-01 MED FILL — AMOXICILLIN 500 MG CAPSULE: 500 | 10 days supply | Qty: 30 | Fill #0

## 2016-01-01 MED FILL — FLUTICASONE PROP 50 MCG SPR: 50 | 30 days supply | Qty: 16 | Fill #0

## 2016-03-15 MED FILL — FLUTICASONE PROP 50 MCG SPR: 50 | 30 days supply | Qty: 16 | Fill #1

## 2016-04-07 DIAGNOSIS — R21 Rash and other nonspecific skin eruption: Secondary | ICD-10-CM | POA: Diagnosis not present

## 2016-04-07 DIAGNOSIS — R739 Hyperglycemia, unspecified: Secondary | ICD-10-CM | POA: Diagnosis not present

## 2016-04-07 DIAGNOSIS — I10 Essential (primary) hypertension: Secondary | ICD-10-CM | POA: Diagnosis not present

## 2016-04-07 DIAGNOSIS — Z23 Encounter for immunization: Secondary | ICD-10-CM | POA: Diagnosis not present

## 2016-04-07 DIAGNOSIS — Z125 Encounter for screening for malignant neoplasm of prostate: Secondary | ICD-10-CM | POA: Diagnosis not present

## 2016-04-07 DIAGNOSIS — E7800 Pure hypercholesterolemia, unspecified: Secondary | ICD-10-CM | POA: Diagnosis not present

## 2016-04-07 DIAGNOSIS — C61 Malignant neoplasm of prostate: Secondary | ICD-10-CM | POA: Diagnosis not present

## 2016-04-07 DIAGNOSIS — Z1389 Encounter for screening for other disorder: Secondary | ICD-10-CM | POA: Diagnosis not present

## 2016-04-07 DIAGNOSIS — Z8546 Personal history of malignant neoplasm of prostate: Secondary | ICD-10-CM | POA: Diagnosis not present

## 2016-04-07 DIAGNOSIS — Z0000 Encounter for general adult medical examination without abnormal findings: Secondary | ICD-10-CM | POA: Diagnosis not present

## 2016-04-07 MED FILL — KETOCONAZOLE 2% CREAM: 2 | 15 days supply | Qty: 30 | Fill #0

## 2016-04-11 DIAGNOSIS — Z23 Encounter for immunization: Secondary | ICD-10-CM | POA: Diagnosis not present

## 2016-04-11 DIAGNOSIS — Z0000 Encounter for general adult medical examination without abnormal findings: Secondary | ICD-10-CM | POA: Diagnosis not present

## 2016-08-29 DIAGNOSIS — N5201 Erectile dysfunction due to arterial insufficiency: Secondary | ICD-10-CM | POA: Diagnosis not present

## 2016-08-29 DIAGNOSIS — C61 Malignant neoplasm of prostate: Secondary | ICD-10-CM | POA: Diagnosis not present

## 2016-08-29 DIAGNOSIS — Z8546 Personal history of malignant neoplasm of prostate: Secondary | ICD-10-CM | POA: Diagnosis not present

## 2016-11-04 DIAGNOSIS — I10 Essential (primary) hypertension: Secondary | ICD-10-CM | POA: Diagnosis not present

## 2016-11-04 DIAGNOSIS — R7303 Prediabetes: Secondary | ICD-10-CM | POA: Diagnosis not present

## 2016-11-04 DIAGNOSIS — R202 Paresthesia of skin: Secondary | ICD-10-CM | POA: Diagnosis not present

## 2016-11-04 DIAGNOSIS — N529 Male erectile dysfunction, unspecified: Secondary | ICD-10-CM | POA: Diagnosis not present

## 2016-12-14 DIAGNOSIS — N529 Male erectile dysfunction, unspecified: Secondary | ICD-10-CM | POA: Diagnosis not present

## 2016-12-14 DIAGNOSIS — M545 Low back pain: Secondary | ICD-10-CM | POA: Diagnosis not present

## 2016-12-21 DIAGNOSIS — R1084 Generalized abdominal pain: Secondary | ICD-10-CM | POA: Diagnosis not present

## 2016-12-21 DIAGNOSIS — R51 Headache: Secondary | ICD-10-CM | POA: Diagnosis not present

## 2016-12-21 DIAGNOSIS — S39012A Strain of muscle, fascia and tendon of lower back, initial encounter: Secondary | ICD-10-CM | POA: Diagnosis not present

## 2017-03-20 DIAGNOSIS — J309 Allergic rhinitis, unspecified: Secondary | ICD-10-CM | POA: Diagnosis not present

## 2017-05-19 DIAGNOSIS — Z8546 Personal history of malignant neoplasm of prostate: Secondary | ICD-10-CM | POA: Diagnosis not present

## 2017-05-19 DIAGNOSIS — Z0000 Encounter for general adult medical examination without abnormal findings: Secondary | ICD-10-CM | POA: Diagnosis not present

## 2017-05-19 DIAGNOSIS — M519 Unspecified thoracic, thoracolumbar and lumbosacral intervertebral disc disorder: Secondary | ICD-10-CM | POA: Diagnosis not present

## 2017-05-19 DIAGNOSIS — Z23 Encounter for immunization: Secondary | ICD-10-CM | POA: Diagnosis not present

## 2017-05-19 DIAGNOSIS — R7303 Prediabetes: Secondary | ICD-10-CM | POA: Diagnosis not present

## 2017-05-19 DIAGNOSIS — J309 Allergic rhinitis, unspecified: Secondary | ICD-10-CM | POA: Diagnosis not present

## 2017-05-19 DIAGNOSIS — I10 Essential (primary) hypertension: Secondary | ICD-10-CM | POA: Diagnosis not present

## 2017-05-19 DIAGNOSIS — E7800 Pure hypercholesterolemia, unspecified: Secondary | ICD-10-CM | POA: Diagnosis not present

## 2017-05-19 DIAGNOSIS — N400 Enlarged prostate without lower urinary tract symptoms: Secondary | ICD-10-CM | POA: Diagnosis not present

## 2017-05-19 DIAGNOSIS — Z1159 Encounter for screening for other viral diseases: Secondary | ICD-10-CM | POA: Diagnosis not present

## 2017-06-19 DIAGNOSIS — J309 Allergic rhinitis, unspecified: Secondary | ICD-10-CM | POA: Diagnosis not present

## 2017-06-19 DIAGNOSIS — J069 Acute upper respiratory infection, unspecified: Secondary | ICD-10-CM | POA: Diagnosis not present

## 2017-09-29 DIAGNOSIS — C61 Malignant neoplasm of prostate: Secondary | ICD-10-CM | POA: Diagnosis not present

## 2017-10-09 DIAGNOSIS — Z8546 Personal history of malignant neoplasm of prostate: Secondary | ICD-10-CM | POA: Diagnosis not present

## 2017-10-09 DIAGNOSIS — N5201 Erectile dysfunction due to arterial insufficiency: Secondary | ICD-10-CM | POA: Diagnosis not present

## 2017-11-21 DIAGNOSIS — C61 Malignant neoplasm of prostate: Secondary | ICD-10-CM | POA: Diagnosis not present

## 2017-11-21 DIAGNOSIS — I10 Essential (primary) hypertension: Secondary | ICD-10-CM | POA: Diagnosis not present

## 2017-11-21 DIAGNOSIS — R7303 Prediabetes: Secondary | ICD-10-CM | POA: Diagnosis not present

## 2017-11-21 DIAGNOSIS — R21 Rash and other nonspecific skin eruption: Secondary | ICD-10-CM | POA: Diagnosis not present

## 2017-11-21 DIAGNOSIS — Z1389 Encounter for screening for other disorder: Secondary | ICD-10-CM | POA: Diagnosis not present

## 2018-01-16 DIAGNOSIS — M545 Low back pain: Secondary | ICD-10-CM | POA: Diagnosis not present

## 2018-01-16 DIAGNOSIS — J309 Allergic rhinitis, unspecified: Secondary | ICD-10-CM | POA: Diagnosis not present

## 2018-05-22 DIAGNOSIS — Z0000 Encounter for general adult medical examination without abnormal findings: Secondary | ICD-10-CM | POA: Diagnosis not present

## 2018-05-22 DIAGNOSIS — Z1389 Encounter for screening for other disorder: Secondary | ICD-10-CM | POA: Diagnosis not present

## 2018-05-22 DIAGNOSIS — I10 Essential (primary) hypertension: Secondary | ICD-10-CM | POA: Diagnosis not present

## 2018-05-22 DIAGNOSIS — J309 Allergic rhinitis, unspecified: Secondary | ICD-10-CM | POA: Diagnosis not present

## 2018-05-22 DIAGNOSIS — Z1211 Encounter for screening for malignant neoplasm of colon: Secondary | ICD-10-CM | POA: Diagnosis not present

## 2018-05-22 DIAGNOSIS — R7303 Prediabetes: Secondary | ICD-10-CM | POA: Diagnosis not present

## 2018-05-22 DIAGNOSIS — C61 Malignant neoplasm of prostate: Secondary | ICD-10-CM | POA: Diagnosis not present

## 2018-05-22 DIAGNOSIS — E7800 Pure hypercholesterolemia, unspecified: Secondary | ICD-10-CM | POA: Diagnosis not present

## 2018-05-22 DIAGNOSIS — N400 Enlarged prostate without lower urinary tract symptoms: Secondary | ICD-10-CM | POA: Diagnosis not present

## 2018-05-30 DIAGNOSIS — H52202 Unspecified astigmatism, left eye: Secondary | ICD-10-CM | POA: Diagnosis not present

## 2018-05-30 DIAGNOSIS — Z961 Presence of intraocular lens: Secondary | ICD-10-CM | POA: Diagnosis not present

## 2018-09-21 DIAGNOSIS — K640 First degree hemorrhoids: Secondary | ICD-10-CM | POA: Diagnosis not present

## 2018-09-21 DIAGNOSIS — Z1211 Encounter for screening for malignant neoplasm of colon: Secondary | ICD-10-CM | POA: Diagnosis not present

## 2018-09-21 DIAGNOSIS — K635 Polyp of colon: Secondary | ICD-10-CM | POA: Diagnosis not present

## 2018-09-25 DIAGNOSIS — K635 Polyp of colon: Secondary | ICD-10-CM | POA: Diagnosis not present

## 2018-11-22 DIAGNOSIS — M6740 Ganglion, unspecified site: Secondary | ICD-10-CM | POA: Diagnosis not present

## 2018-11-22 DIAGNOSIS — R7303 Prediabetes: Secondary | ICD-10-CM | POA: Diagnosis not present

## 2018-11-22 DIAGNOSIS — Z8546 Personal history of malignant neoplasm of prostate: Secondary | ICD-10-CM | POA: Diagnosis not present

## 2018-11-22 DIAGNOSIS — I10 Essential (primary) hypertension: Secondary | ICD-10-CM | POA: Diagnosis not present

## 2018-12-12 DIAGNOSIS — M1811 Unilateral primary osteoarthritis of first carpometacarpal joint, right hand: Secondary | ICD-10-CM | POA: Diagnosis not present

## 2018-12-12 DIAGNOSIS — M67431 Ganglion, right wrist: Secondary | ICD-10-CM | POA: Diagnosis not present

## 2019-05-27 DIAGNOSIS — Z1389 Encounter for screening for other disorder: Secondary | ICD-10-CM | POA: Diagnosis not present

## 2019-05-27 DIAGNOSIS — E7800 Pure hypercholesterolemia, unspecified: Secondary | ICD-10-CM | POA: Diagnosis not present

## 2019-05-27 DIAGNOSIS — C61 Malignant neoplasm of prostate: Secondary | ICD-10-CM | POA: Diagnosis not present

## 2019-05-27 DIAGNOSIS — Z23 Encounter for immunization: Secondary | ICD-10-CM | POA: Diagnosis not present

## 2019-05-27 DIAGNOSIS — R7303 Prediabetes: Secondary | ICD-10-CM | POA: Diagnosis not present

## 2019-05-27 DIAGNOSIS — I10 Essential (primary) hypertension: Secondary | ICD-10-CM | POA: Diagnosis not present

## 2019-05-27 DIAGNOSIS — Z0000 Encounter for general adult medical examination without abnormal findings: Secondary | ICD-10-CM | POA: Diagnosis not present

## 2019-05-27 DIAGNOSIS — J309 Allergic rhinitis, unspecified: Secondary | ICD-10-CM | POA: Diagnosis not present

## 2019-05-27 MED FILL — metFORMIN HCL 500 MG TABS: 500 | 90 days supply | Qty: 90 | Fill #0

## 2019-05-27 MED FILL — LISINOPRIL-HCTZ 20-12.5 MG: 20-12.5 | 90 days supply | Qty: 90 | Fill #0

## 2019-06-07 DIAGNOSIS — E119 Type 2 diabetes mellitus without complications: Secondary | ICD-10-CM | POA: Diagnosis not present

## 2019-06-07 DIAGNOSIS — Z961 Presence of intraocular lens: Secondary | ICD-10-CM | POA: Diagnosis not present

## 2019-07-19 DIAGNOSIS — Z8546 Personal history of malignant neoplasm of prostate: Secondary | ICD-10-CM | POA: Diagnosis not present

## 2019-07-31 DIAGNOSIS — J309 Allergic rhinitis, unspecified: Secondary | ICD-10-CM | POA: Diagnosis not present

## 2019-07-31 MED FILL — FLUTICASONE PROP 50 MCG SPR: 50 | 25 days supply | Qty: 16 | Fill #0

## 2019-08-28 DIAGNOSIS — I10 Essential (primary) hypertension: Secondary | ICD-10-CM | POA: Diagnosis not present

## 2019-08-28 DIAGNOSIS — Z8546 Personal history of malignant neoplasm of prostate: Secondary | ICD-10-CM | POA: Diagnosis not present

## 2019-08-28 DIAGNOSIS — Z23 Encounter for immunization: Secondary | ICD-10-CM | POA: Diagnosis not present

## 2019-08-28 DIAGNOSIS — R7303 Prediabetes: Secondary | ICD-10-CM | POA: Diagnosis not present

## 2019-08-28 DIAGNOSIS — J309 Allergic rhinitis, unspecified: Secondary | ICD-10-CM | POA: Diagnosis not present

## 2020-02-26 DIAGNOSIS — K219 Gastro-esophageal reflux disease without esophagitis: Secondary | ICD-10-CM | POA: Diagnosis not present

## 2020-02-26 DIAGNOSIS — E7800 Pure hypercholesterolemia, unspecified: Secondary | ICD-10-CM | POA: Diagnosis not present

## 2020-02-26 DIAGNOSIS — M519 Unspecified thoracic, thoracolumbar and lumbosacral intervertebral disc disorder: Secondary | ICD-10-CM | POA: Diagnosis not present

## 2020-02-26 DIAGNOSIS — Z0000 Encounter for general adult medical examination without abnormal findings: Secondary | ICD-10-CM | POA: Diagnosis not present

## 2020-02-26 DIAGNOSIS — N400 Enlarged prostate without lower urinary tract symptoms: Secondary | ICD-10-CM | POA: Diagnosis not present

## 2020-02-26 DIAGNOSIS — R7309 Other abnormal glucose: Secondary | ICD-10-CM | POA: Diagnosis not present

## 2020-02-26 DIAGNOSIS — Z8546 Personal history of malignant neoplasm of prostate: Secondary | ICD-10-CM | POA: Diagnosis not present

## 2020-02-26 DIAGNOSIS — I10 Essential (primary) hypertension: Secondary | ICD-10-CM | POA: Diagnosis not present

## 2020-02-26 DIAGNOSIS — Z1389 Encounter for screening for other disorder: Secondary | ICD-10-CM | POA: Diagnosis not present

## 2020-02-26 DIAGNOSIS — J309 Allergic rhinitis, unspecified: Secondary | ICD-10-CM | POA: Diagnosis not present

## 2020-06-08 DIAGNOSIS — Z961 Presence of intraocular lens: Secondary | ICD-10-CM | POA: Diagnosis not present

## 2020-06-08 DIAGNOSIS — E119 Type 2 diabetes mellitus without complications: Secondary | ICD-10-CM | POA: Diagnosis not present

## 2020-08-18 ENCOUNTER — Ambulatory Visit
Admission: RE | Admit: 2020-08-18 | Discharge: 2020-08-18 | Disposition: A | Discharge status: Home / Self Care | Payer: Self-pay | Source: Ambulatory Visit | Attending: Internal Medicine | Admitting: Internal Medicine

## 2020-08-18 ENCOUNTER — Other Ambulatory Visit: Payer: Self-pay | Admitting: Internal Medicine

## 2020-08-18 ENCOUNTER — Other Ambulatory Visit (HOSPITAL_COMMUNITY): Payer: Self-pay | Admitting: Internal Medicine

## 2020-08-18 DIAGNOSIS — I10 Essential (primary) hypertension: Secondary | ICD-10-CM | POA: Diagnosis not present

## 2020-08-18 DIAGNOSIS — R0789 Other chest pain: Secondary | ICD-10-CM

## 2020-08-18 DIAGNOSIS — C61 Malignant neoplasm of prostate: Secondary | ICD-10-CM | POA: Diagnosis not present

## 2020-08-18 MED FILL — IBUPROFEN 600 MG TABLET: 600 | 15 days supply | Qty: 30 | Fill #0

## 2020-08-31 DIAGNOSIS — Z23 Encounter for immunization: Secondary | ICD-10-CM | POA: Diagnosis not present

## 2020-08-31 DIAGNOSIS — I10 Essential (primary) hypertension: Secondary | ICD-10-CM | POA: Diagnosis not present

## 2020-08-31 DIAGNOSIS — E7800 Pure hypercholesterolemia, unspecified: Secondary | ICD-10-CM | POA: Diagnosis not present

## 2020-08-31 DIAGNOSIS — Z8546 Personal history of malignant neoplasm of prostate: Secondary | ICD-10-CM | POA: Diagnosis not present

## 2020-08-31 DIAGNOSIS — R7309 Other abnormal glucose: Secondary | ICD-10-CM | POA: Diagnosis not present

## 2020-10-06 DIAGNOSIS — Z8546 Personal history of malignant neoplasm of prostate: Secondary | ICD-10-CM | POA: Diagnosis not present

## 2020-10-13 ENCOUNTER — Other Ambulatory Visit (HOSPITAL_COMMUNITY): Payer: Self-pay | Admitting: Urology

## 2020-10-13 DIAGNOSIS — N5231 Erectile dysfunction following radical prostatectomy: Secondary | ICD-10-CM | POA: Diagnosis not present

## 2020-10-13 DIAGNOSIS — Z8546 Personal history of malignant neoplasm of prostate: Secondary | ICD-10-CM | POA: Diagnosis not present

## 2020-10-13 MED FILL — TADALAFIL 5 MG TABS: 5 | 8 days supply | Qty: 30 | Fill #0

## 2020-12-18 ENCOUNTER — Other Ambulatory Visit (HOSPITAL_COMMUNITY): Payer: Self-pay | Admitting: Physician Assistant

## 2020-12-18 DIAGNOSIS — J019 Acute sinusitis, unspecified: Secondary | ICD-10-CM | POA: Diagnosis not present

## 2020-12-18 MED FILL — AMOX-CLAV 875-125 MG TABLET: 875-125 | 7 days supply | Qty: 14 | Fill #0

## 2020-12-18 MED FILL — FLUTICASONE PROP 50 MCG SPR: 50 | 30 days supply | Qty: 16 | Fill #0

## 2021-03-01 ENCOUNTER — Other Ambulatory Visit (HOSPITAL_COMMUNITY): Payer: Self-pay

## 2021-03-01 DIAGNOSIS — Z0000 Encounter for general adult medical examination without abnormal findings: Secondary | ICD-10-CM | POA: Diagnosis not present

## 2021-03-01 DIAGNOSIS — Z8546 Personal history of malignant neoplasm of prostate: Secondary | ICD-10-CM | POA: Diagnosis not present

## 2021-03-01 DIAGNOSIS — I10 Essential (primary) hypertension: Secondary | ICD-10-CM | POA: Diagnosis not present

## 2021-03-01 DIAGNOSIS — J309 Allergic rhinitis, unspecified: Secondary | ICD-10-CM | POA: Diagnosis not present

## 2021-03-01 DIAGNOSIS — R7303 Prediabetes: Secondary | ICD-10-CM | POA: Diagnosis not present

## 2021-03-01 MED ORDER — FLUTICASONE PROPIONATE 50 MCG/ACT NA SUSP
NASAL | 4 refills | Status: AC
  Filled 2021-03-01: qty 16, 53d supply, fill #0
  Filled 2021-03-11: qty 16, 30d supply, fill #0

## 2021-03-10 ENCOUNTER — Other Ambulatory Visit (HOSPITAL_COMMUNITY): Payer: Self-pay

## 2021-03-11 ENCOUNTER — Other Ambulatory Visit (HOSPITAL_COMMUNITY): Payer: Self-pay

## 2021-03-15 ENCOUNTER — Other Ambulatory Visit (HOSPITAL_COMMUNITY): Payer: Self-pay

## 2021-03-16 ENCOUNTER — Other Ambulatory Visit: Payer: Self-pay | Admitting: Internal Medicine

## 2021-03-16 ENCOUNTER — Ambulatory Visit
Admission: RE | Admit: 2021-03-16 | Discharge: 2021-03-16 | Disposition: A | Discharge status: Home / Self Care | Payer: Medicare HMO | Source: Ambulatory Visit | Attending: Internal Medicine | Admitting: Internal Medicine

## 2021-03-16 ENCOUNTER — Other Ambulatory Visit (HOSPITAL_COMMUNITY): Payer: Self-pay

## 2021-03-16 DIAGNOSIS — M25569 Pain in unspecified knee: Secondary | ICD-10-CM

## 2021-03-16 DIAGNOSIS — M25462 Effusion, left knee: Secondary | ICD-10-CM | POA: Diagnosis not present

## 2021-03-16 DIAGNOSIS — M1712 Unilateral primary osteoarthritis, left knee: Secondary | ICD-10-CM | POA: Diagnosis not present

## 2021-03-16 MED ORDER — PREDNISONE 20 MG PO TABS
20.0000 mg | ORAL_TABLET | Freq: Two times a day (BID) | ORAL | 0 refills | Status: AC
  Filled 2021-03-16: qty 10, 5d supply, fill #0

## 2021-04-06 DIAGNOSIS — I10 Essential (primary) hypertension: Secondary | ICD-10-CM | POA: Diagnosis not present

## 2021-04-30 ENCOUNTER — Other Ambulatory Visit (HOSPITAL_COMMUNITY): Payer: Self-pay

## 2021-04-30 DIAGNOSIS — R21 Rash and other nonspecific skin eruption: Secondary | ICD-10-CM | POA: Diagnosis not present

## 2021-04-30 MED ORDER — KETOCONAZOLE 2 % EX CREA
TOPICAL_CREAM | CUTANEOUS | 4 refills | Status: AC
  Filled 2021-04-30: qty 30, 30d supply, fill #0

## 2021-04-30 MED ORDER — FLUCONAZOLE 100 MG PO TABS
100.0000 mg | ORAL_TABLET | ORAL | 0 refills | Status: AC
  Filled 2021-04-30: qty 3, 9d supply, fill #0

## 2021-06-16 DIAGNOSIS — Z961 Presence of intraocular lens: Secondary | ICD-10-CM | POA: Diagnosis not present

## 2021-06-16 DIAGNOSIS — E119 Type 2 diabetes mellitus without complications: Secondary | ICD-10-CM | POA: Diagnosis not present

## 2021-08-23 DIAGNOSIS — Z23 Encounter for immunization: Secondary | ICD-10-CM | POA: Diagnosis not present

## 2021-08-23 DIAGNOSIS — I10 Essential (primary) hypertension: Secondary | ICD-10-CM | POA: Diagnosis not present

## 2021-08-23 DIAGNOSIS — J309 Allergic rhinitis, unspecified: Secondary | ICD-10-CM | POA: Diagnosis not present

## 2021-08-23 DIAGNOSIS — E7800 Pure hypercholesterolemia, unspecified: Secondary | ICD-10-CM | POA: Diagnosis not present

## 2021-08-23 DIAGNOSIS — R7303 Prediabetes: Secondary | ICD-10-CM | POA: Diagnosis not present

## 2021-08-23 DIAGNOSIS — Z8546 Personal history of malignant neoplasm of prostate: Secondary | ICD-10-CM | POA: Diagnosis not present

## 2021-11-29 DIAGNOSIS — R52 Pain, unspecified: Secondary | ICD-10-CM | POA: Diagnosis not present

## 2021-11-29 DIAGNOSIS — R051 Acute cough: Secondary | ICD-10-CM | POA: Diagnosis not present

## 2021-11-29 DIAGNOSIS — U071 COVID-19: Secondary | ICD-10-CM | POA: Diagnosis not present

## 2021-11-29 DIAGNOSIS — R509 Fever, unspecified: Secondary | ICD-10-CM | POA: Diagnosis not present

## 2021-11-30 ENCOUNTER — Other Ambulatory Visit (HOSPITAL_COMMUNITY): Payer: Self-pay

## 2021-11-30 MED ORDER — NIRMATRELVIR&RITONAVIR 300/100 20 X 150 MG & 10 X 100MG PO TBPK
3.0000 | ORAL_TABLET | Freq: Two times a day (BID) | ORAL | 0 refills | Status: AC
  Filled 2021-11-30: qty 30, 5d supply, fill #0

## 2021-12-06 ENCOUNTER — Other Ambulatory Visit (HOSPITAL_COMMUNITY): Payer: Self-pay

## 2021-12-06 MED ORDER — CETIRIZINE HCL 10 MG PO TABS
10.0000 mg | ORAL_TABLET | Freq: Every day | ORAL | 2 refills | Status: AC
  Filled 2021-12-06: qty 30, 30d supply, fill #0

## 2021-12-06 MED ORDER — FLUTICASONE PROPIONATE 50 MCG/ACT NA SUSP
NASAL | 0 refills | Status: AC
  Filled 2021-12-06: qty 16, 60d supply, fill #0

## 2022-01-17 IMAGING — DX DG CHEST 2V
2 series · 2 of 2 positions shown · non-contrast
Comparison: 08/27/2012

CLINICAL DATA: Chest wall discomfort for 1 week, hypertension,
prostate cancer

EXAM:
CHEST - 2 VIEW

[dg chest 2 view (1 of 2)]
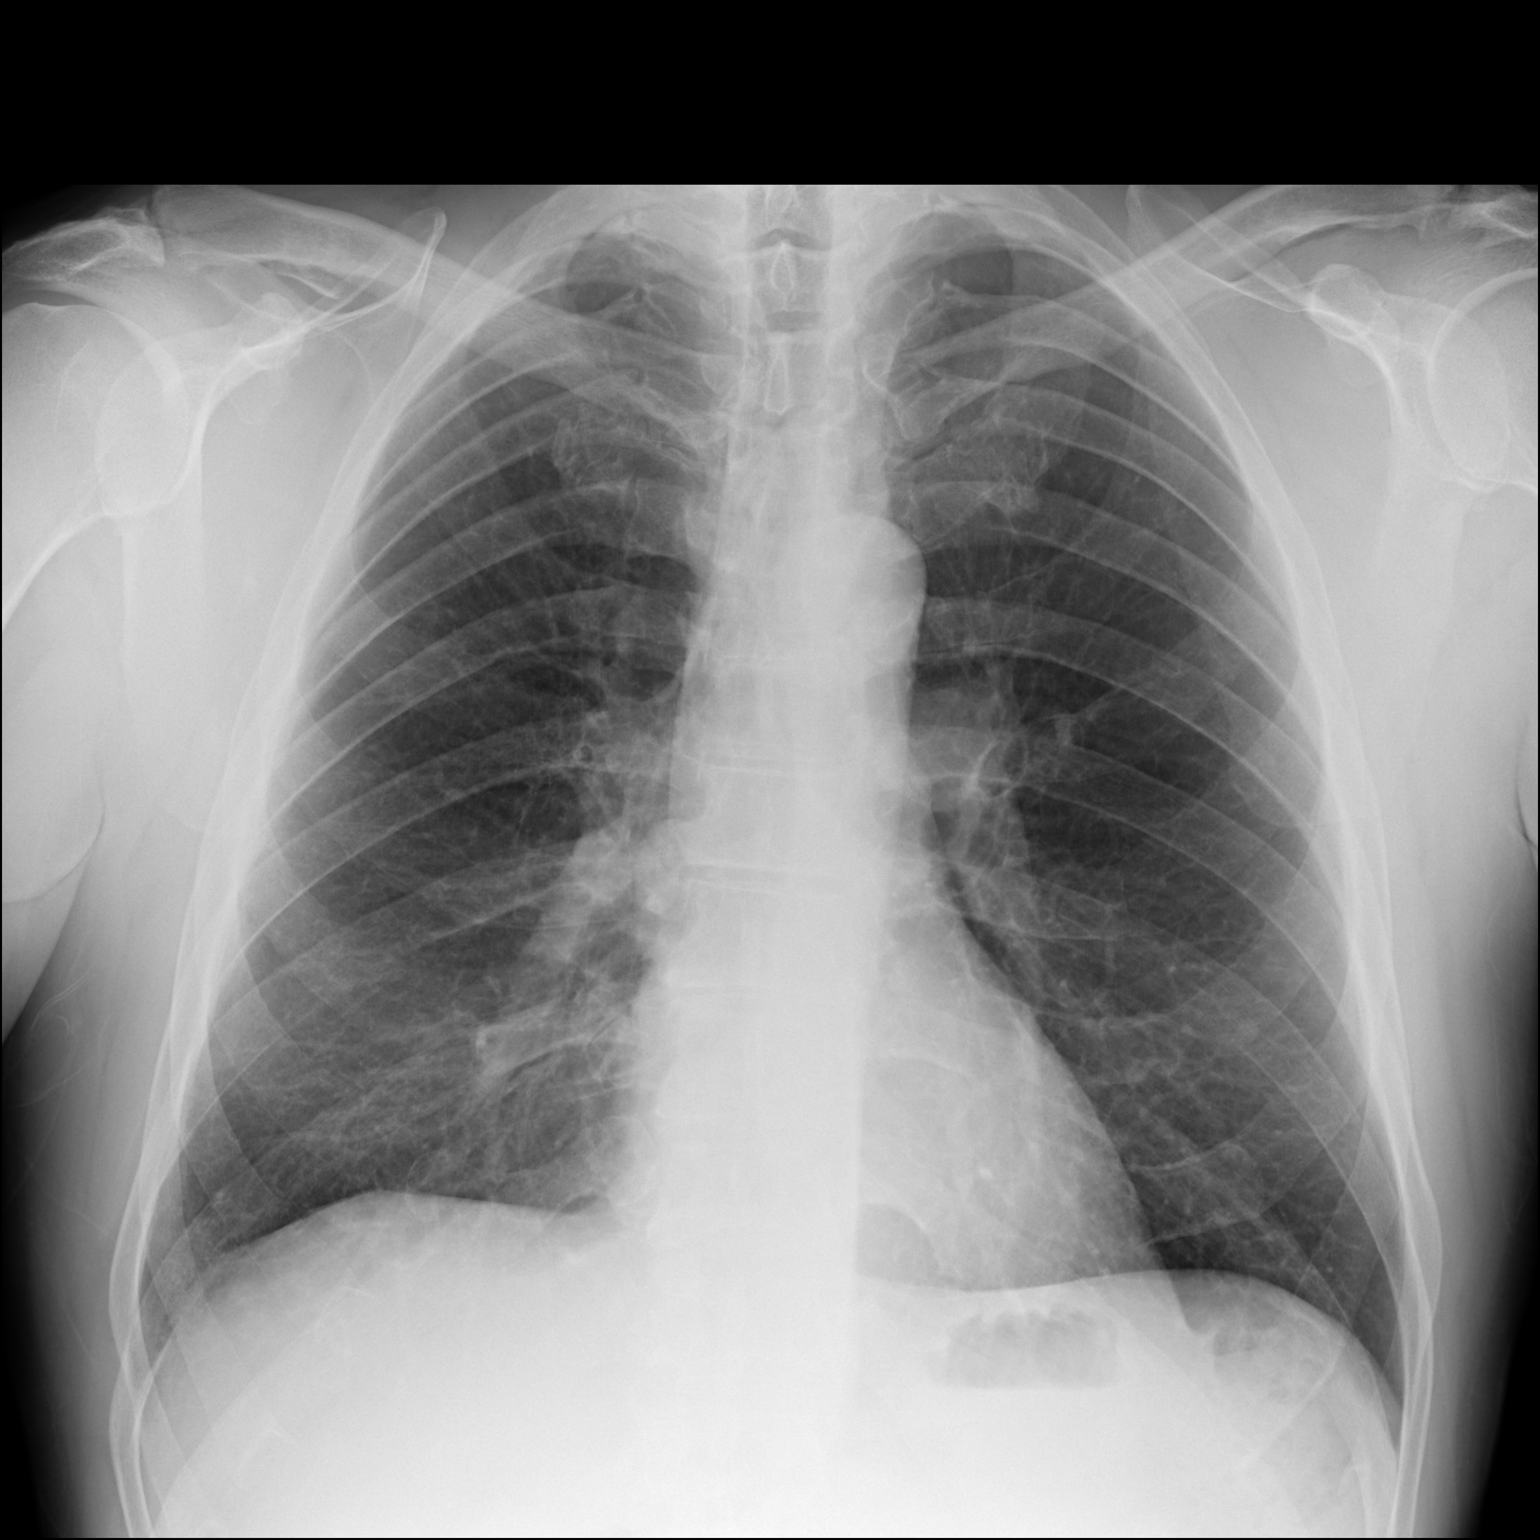

[dg chest 2 view (2 of 2)]
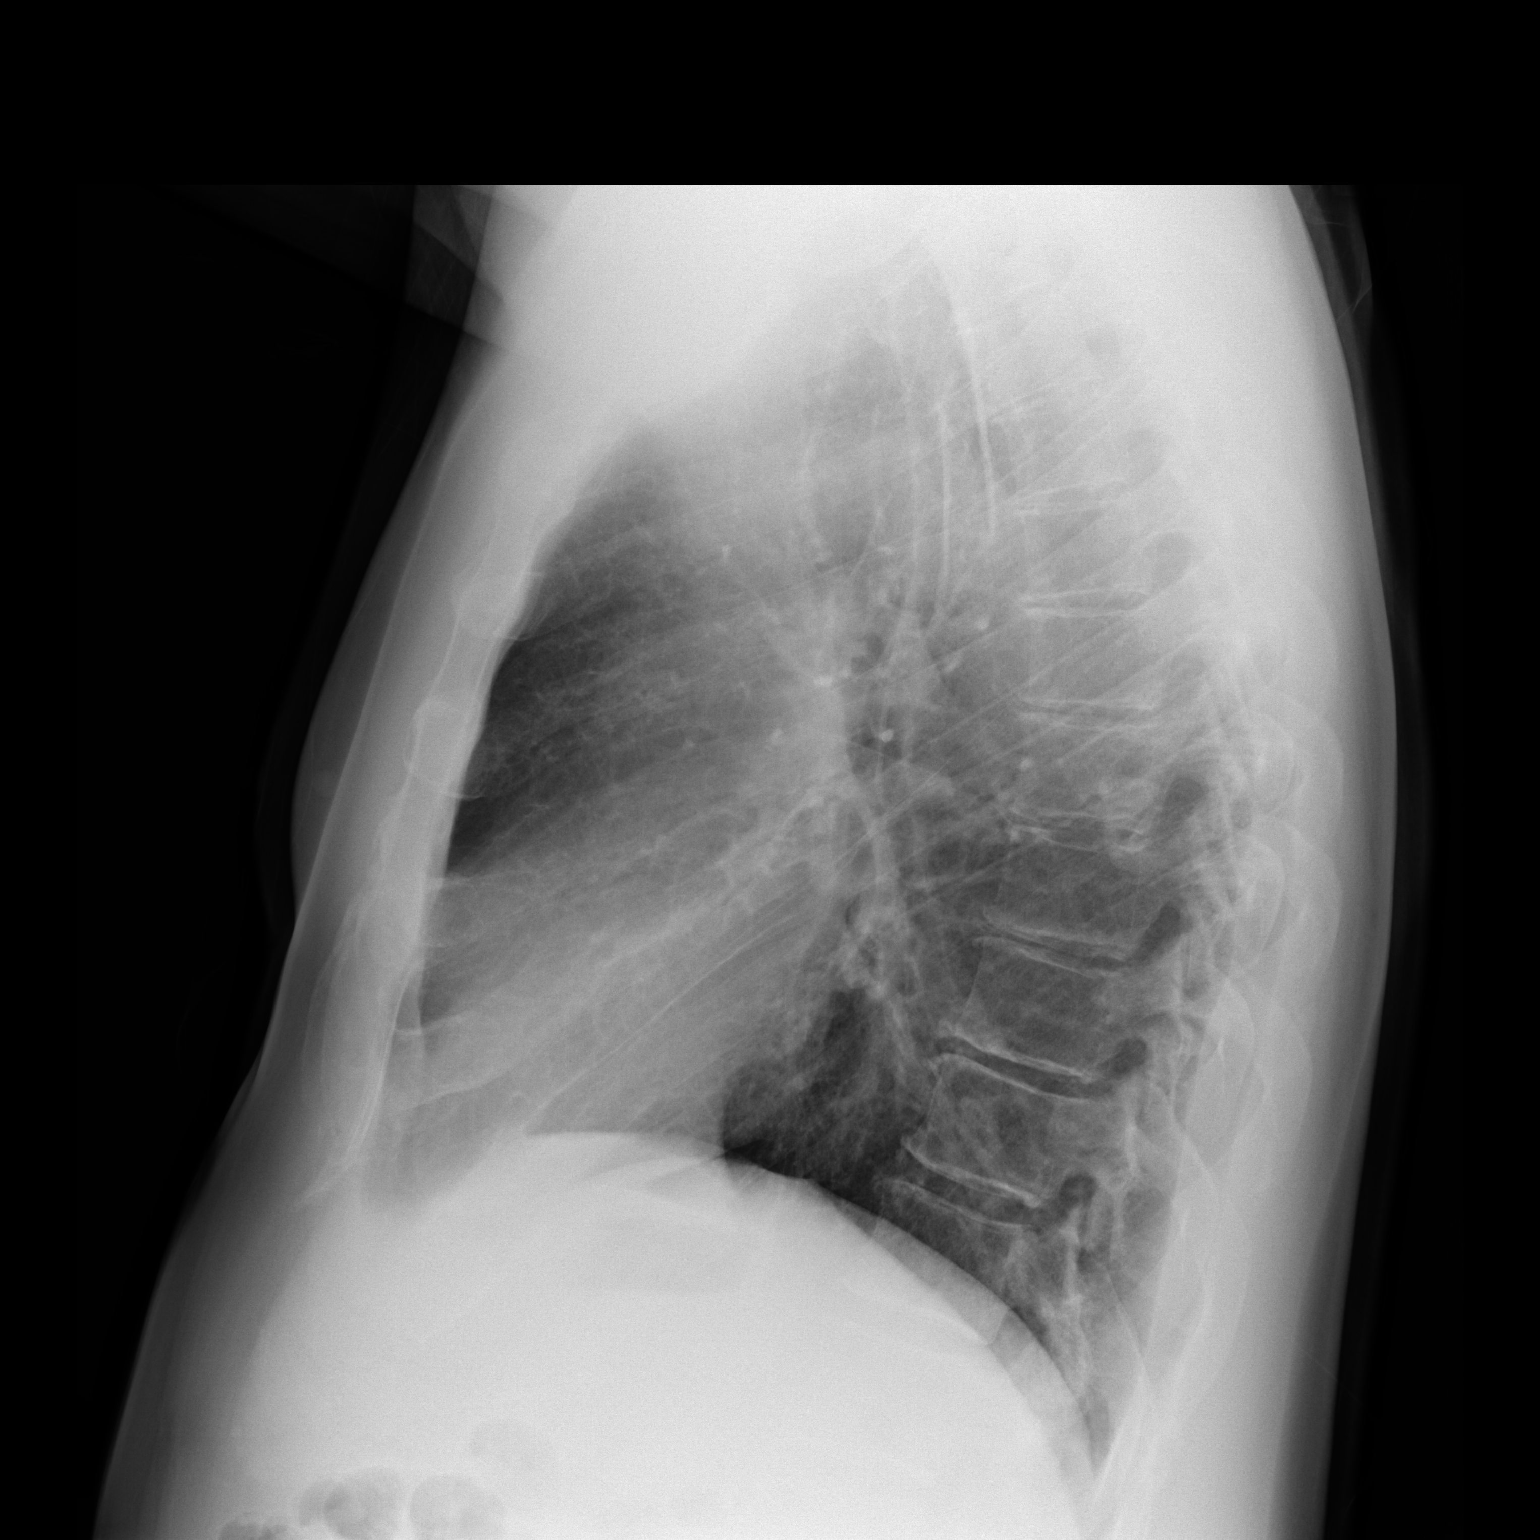

[2 of 2 positions shown; findings below may reference images not displayed]

FINDINGS: Frontal and lateral views of the chest demonstrate an unremarkable
cardiac silhouette. No airspace disease, effusion, or pneumothorax.
No acute bony abnormalities.
IMPRESSION: 1. No acute intrathoracic process.

## 2022-02-04 ENCOUNTER — Other Ambulatory Visit (HOSPITAL_COMMUNITY): Payer: Self-pay

## 2022-02-04 DIAGNOSIS — R059 Cough, unspecified: Secondary | ICD-10-CM | POA: Diagnosis not present

## 2022-02-04 DIAGNOSIS — J209 Acute bronchitis, unspecified: Secondary | ICD-10-CM | POA: Diagnosis not present

## 2022-02-04 MED ORDER — AZITHROMYCIN 250 MG PO TABS
ORAL_TABLET | ORAL | 0 refills | Status: AC
  Filled 2022-02-04: qty 6, 5d supply, fill #0

## 2022-03-02 DIAGNOSIS — I10 Essential (primary) hypertension: Secondary | ICD-10-CM | POA: Diagnosis not present

## 2022-03-02 DIAGNOSIS — K219 Gastro-esophageal reflux disease without esophagitis: Secondary | ICD-10-CM | POA: Diagnosis not present

## 2022-03-02 DIAGNOSIS — E7800 Pure hypercholesterolemia, unspecified: Secondary | ICD-10-CM | POA: Diagnosis not present

## 2022-03-02 DIAGNOSIS — N400 Enlarged prostate without lower urinary tract symptoms: Secondary | ICD-10-CM | POA: Diagnosis not present

## 2022-03-02 DIAGNOSIS — R7303 Prediabetes: Secondary | ICD-10-CM | POA: Diagnosis not present

## 2022-03-02 DIAGNOSIS — J309 Allergic rhinitis, unspecified: Secondary | ICD-10-CM | POA: Diagnosis not present

## 2022-03-02 DIAGNOSIS — M519 Unspecified thoracic, thoracolumbar and lumbosacral intervertebral disc disorder: Secondary | ICD-10-CM | POA: Diagnosis not present

## 2022-03-02 DIAGNOSIS — Z8546 Personal history of malignant neoplasm of prostate: Secondary | ICD-10-CM | POA: Diagnosis not present

## 2022-03-02 DIAGNOSIS — N182 Chronic kidney disease, stage 2 (mild): Secondary | ICD-10-CM | POA: Diagnosis not present

## 2022-05-11 ENCOUNTER — Other Ambulatory Visit (HOSPITAL_COMMUNITY): Payer: Self-pay

## 2022-05-11 DIAGNOSIS — R0981 Nasal congestion: Secondary | ICD-10-CM | POA: Diagnosis not present

## 2022-05-11 DIAGNOSIS — Z03818 Encounter for observation for suspected exposure to other biological agents ruled out: Secondary | ICD-10-CM | POA: Diagnosis not present

## 2022-05-11 DIAGNOSIS — R059 Cough, unspecified: Secondary | ICD-10-CM | POA: Diagnosis not present

## 2022-05-11 MED ORDER — AZITHROMYCIN 250 MG PO TABS
ORAL_TABLET | ORAL | 0 refills | Status: AC
Start: 2022-05-11 — End: ?
  Filled 2022-05-11: qty 6, 5d supply, fill #0

## 2022-05-11 MED ORDER — BENZONATATE 200 MG PO CAPS
200.0000 mg | ORAL_CAPSULE | Freq: Three times a day (TID) | ORAL | 0 refills | Status: AC
  Filled 2022-05-11: qty 30, 10d supply, fill #0

## 2022-05-12 ENCOUNTER — Other Ambulatory Visit (HOSPITAL_COMMUNITY): Payer: Self-pay

## 2022-06-20 DIAGNOSIS — Z961 Presence of intraocular lens: Secondary | ICD-10-CM | POA: Diagnosis not present

## 2022-06-20 DIAGNOSIS — H52203 Unspecified astigmatism, bilateral: Secondary | ICD-10-CM | POA: Diagnosis not present

## 2022-06-20 DIAGNOSIS — R7303 Prediabetes: Secondary | ICD-10-CM | POA: Diagnosis not present

## 2022-09-12 DIAGNOSIS — M179 Osteoarthritis of knee, unspecified: Secondary | ICD-10-CM | POA: Diagnosis not present

## 2022-09-12 DIAGNOSIS — I10 Essential (primary) hypertension: Secondary | ICD-10-CM | POA: Diagnosis not present

## 2022-09-12 DIAGNOSIS — J309 Allergic rhinitis, unspecified: Secondary | ICD-10-CM | POA: Diagnosis not present

## 2022-09-12 DIAGNOSIS — N182 Chronic kidney disease, stage 2 (mild): Secondary | ICD-10-CM | POA: Diagnosis not present

## 2022-09-12 DIAGNOSIS — Z8546 Personal history of malignant neoplasm of prostate: Secondary | ICD-10-CM | POA: Diagnosis not present

## 2022-09-12 DIAGNOSIS — R7303 Prediabetes: Secondary | ICD-10-CM | POA: Diagnosis not present

## 2022-09-12 DIAGNOSIS — Z23 Encounter for immunization: Secondary | ICD-10-CM | POA: Diagnosis not present

## 2022-10-18 DIAGNOSIS — C61 Malignant neoplasm of prostate: Secondary | ICD-10-CM | POA: Diagnosis not present

## 2022-10-18 DIAGNOSIS — Z8546 Personal history of malignant neoplasm of prostate: Secondary | ICD-10-CM | POA: Diagnosis not present

## 2022-10-19 DIAGNOSIS — N5201 Erectile dysfunction due to arterial insufficiency: Secondary | ICD-10-CM | POA: Diagnosis not present

## 2022-10-19 DIAGNOSIS — Z8546 Personal history of malignant neoplasm of prostate: Secondary | ICD-10-CM | POA: Diagnosis not present

## 2022-10-19 DIAGNOSIS — C61 Malignant neoplasm of prostate: Secondary | ICD-10-CM | POA: Diagnosis not present

## 2022-12-14 ENCOUNTER — Other Ambulatory Visit: Payer: Self-pay

## 2022-12-14 ENCOUNTER — Other Ambulatory Visit (HOSPITAL_COMMUNITY): Payer: Self-pay

## 2022-12-14 DIAGNOSIS — Z03818 Encounter for observation for suspected exposure to other biological agents ruled out: Secondary | ICD-10-CM | POA: Diagnosis not present

## 2022-12-14 DIAGNOSIS — R0981 Nasal congestion: Secondary | ICD-10-CM | POA: Diagnosis not present

## 2022-12-14 DIAGNOSIS — R059 Cough, unspecified: Secondary | ICD-10-CM | POA: Diagnosis not present

## 2022-12-14 DIAGNOSIS — R52 Pain, unspecified: Secondary | ICD-10-CM | POA: Diagnosis not present

## 2022-12-14 MED ORDER — HYDROCOD POLI-CHLORPHE POLI ER 10-8 MG/5ML PO SUER
5.0000 mL | Freq: Every evening | ORAL | 0 refills | Status: AC
  Filled 2022-12-14: qty 100, 20d supply, fill #0

## 2022-12-15 ENCOUNTER — Other Ambulatory Visit: Payer: Self-pay

## 2022-12-16 ENCOUNTER — Other Ambulatory Visit (HOSPITAL_COMMUNITY): Payer: Self-pay

## 2022-12-23 ENCOUNTER — Other Ambulatory Visit: Payer: Self-pay

## 2022-12-23 ENCOUNTER — Other Ambulatory Visit (HOSPITAL_COMMUNITY): Payer: Self-pay

## 2022-12-26 ENCOUNTER — Other Ambulatory Visit: Payer: Self-pay

## 2023-03-16 ENCOUNTER — Other Ambulatory Visit (HOSPITAL_COMMUNITY): Payer: Self-pay

## 2023-03-16 DIAGNOSIS — Z8546 Personal history of malignant neoplasm of prostate: Secondary | ICD-10-CM | POA: Diagnosis not present

## 2023-03-16 DIAGNOSIS — Z0000 Encounter for general adult medical examination without abnormal findings: Secondary | ICD-10-CM | POA: Diagnosis not present

## 2023-03-16 DIAGNOSIS — M179 Osteoarthritis of knee, unspecified: Secondary | ICD-10-CM | POA: Diagnosis not present

## 2023-03-16 DIAGNOSIS — I10 Essential (primary) hypertension: Secondary | ICD-10-CM | POA: Diagnosis not present

## 2023-03-16 DIAGNOSIS — N400 Enlarged prostate without lower urinary tract symptoms: Secondary | ICD-10-CM | POA: Diagnosis not present

## 2023-03-16 DIAGNOSIS — E7800 Pure hypercholesterolemia, unspecified: Secondary | ICD-10-CM | POA: Diagnosis not present

## 2023-03-16 DIAGNOSIS — N182 Chronic kidney disease, stage 2 (mild): Secondary | ICD-10-CM | POA: Diagnosis not present

## 2023-03-16 DIAGNOSIS — R7303 Prediabetes: Secondary | ICD-10-CM | POA: Diagnosis not present

## 2023-03-16 DIAGNOSIS — J309 Allergic rhinitis, unspecified: Secondary | ICD-10-CM | POA: Diagnosis not present

## 2023-03-16 DIAGNOSIS — R7309 Other abnormal glucose: Secondary | ICD-10-CM | POA: Diagnosis not present

## 2023-03-16 MED ORDER — FLUTICASONE PROPIONATE 50 MCG/ACT NA SUSP
1.0000 | Freq: Every day | NASAL | 4 refills | Status: AC
  Filled 2023-03-16: qty 16, 30d supply, fill #0

## 2023-04-05 ENCOUNTER — Other Ambulatory Visit (HOSPITAL_COMMUNITY): Payer: Self-pay

## 2023-04-18 DIAGNOSIS — I10 Essential (primary) hypertension: Secondary | ICD-10-CM | POA: Diagnosis not present

## 2023-09-15 ENCOUNTER — Other Ambulatory Visit (HOSPITAL_COMMUNITY): Payer: Self-pay

## 2023-09-15 DIAGNOSIS — N1831 Chronic kidney disease, stage 3a: Secondary | ICD-10-CM | POA: Diagnosis not present

## 2023-09-15 DIAGNOSIS — I10 Essential (primary) hypertension: Secondary | ICD-10-CM | POA: Diagnosis not present

## 2023-09-15 DIAGNOSIS — R202 Paresthesia of skin: Secondary | ICD-10-CM | POA: Diagnosis not present

## 2023-09-15 MED ORDER — AMLODIPINE BESYLATE 10 MG PO TABS
10.0000 mg | ORAL_TABLET | Freq: Every day | ORAL | 5 refills | Status: DC
  Filled 2023-09-15: qty 90, 90d supply, fill #0
  Filled 2023-12-19: qty 90, 90d supply, fill #1
  Filled 2024-03-26: qty 90, 90d supply, fill #2
  Filled 2024-06-26: qty 90, 90d supply, fill #3

## 2023-09-25 DIAGNOSIS — I10 Essential (primary) hypertension: Secondary | ICD-10-CM | POA: Diagnosis not present

## 2023-09-25 DIAGNOSIS — N529 Male erectile dysfunction, unspecified: Secondary | ICD-10-CM | POA: Diagnosis not present

## 2023-09-25 DIAGNOSIS — N183 Chronic kidney disease, stage 3 unspecified: Secondary | ICD-10-CM | POA: Diagnosis not present

## 2023-10-19 DIAGNOSIS — Z961 Presence of intraocular lens: Secondary | ICD-10-CM | POA: Diagnosis not present

## 2023-10-19 DIAGNOSIS — H52203 Unspecified astigmatism, bilateral: Secondary | ICD-10-CM | POA: Diagnosis not present

## 2023-10-19 DIAGNOSIS — E119 Type 2 diabetes mellitus without complications: Secondary | ICD-10-CM | POA: Diagnosis not present

## 2023-10-30 ENCOUNTER — Encounter: Payer: Self-pay | Admitting: Neurology

## 2023-11-27 ENCOUNTER — Other Ambulatory Visit: Payer: Self-pay

## 2023-11-27 DIAGNOSIS — R202 Paresthesia of skin: Secondary | ICD-10-CM

## 2023-12-01 ENCOUNTER — Ambulatory Visit (INDEPENDENT_AMBULATORY_CARE_PROVIDER_SITE_OTHER): Payer: No Typology Code available for payment source | Admitting: Neurology

## 2023-12-01 DIAGNOSIS — R202 Paresthesia of skin: Secondary | ICD-10-CM

## 2023-12-01 NOTE — Procedures (Signed)
[facility]  [address], [address]  [place], [place] [address] Tel: [phone] Fax: [phone] Test Date:  12/01/2023  Patient: Thomas Conway DOB: 1951-05-12 Physician: Nita Sickle, DO  Sex: Male Height: 6\' 2"  Ref Phys: Margie Ege, DO  ID#: [id number]   Technician:    History: This is a 73 year old man referred for evaluation of bilateral feet numbness and tingling.  NCV & EMG Findings: Electrodiagnostic testing of the right lower extremity and additional studies of the left shows: Bilateral sural and superficial peroneal sensory responses are within normal limits. Bilateral peroneal and tibial motor responses are within normal limits. Bilateral tibial H reflex studies are within normal limits. There is no evidence of active or chronic motor axonal changes affecting any of the tested muscles.  Motor unit configuration and recruitment pattern is within normal limits.   Impression: This is a normal study of the lower extremities.  In particular, there is no evidence of a large fiber sensorimotor polyneuropathy or lumbosacral radiculopathy.    ___________________________ Nita Sickle, DO    Nerve Conduction Studies   Stim Site NR Peak (ms) Norm Peak (ms) O-P Amp (V) Norm O-P Amp  Left Sup Peroneal Anti Sensory (Ant Lat Mall)  32 C  12 cm    2.0 <4.6 4.2 >3  Right Sup Peroneal Anti Sensory (Ant Lat Mall)  32 C  12 cm    2.2 <4.6 5.2 >3  Left Sural Anti Sensory (Lat Mall)  32 C  Calf    2.7 <4.6 7.0 >3  Right Sural Anti Sensory (Lat Mall)  32 C  Calf    2.4 <4.6 7.9 >3     Stim Site NR Onset (ms) Norm Onset (ms) O-P Amp (mV) Norm O-P Amp Site1 Site2 Delta-0 (ms) Dist (cm) Vel (m/s) Norm Vel (m/s)  Left Peroneal Motor (Ext Dig Brev)  32 C  Ankle    3.4 <6.0 4.8 >2.5 B Fib Ankle 9.1 42.0 46 >40  B Fib    12.5  4.1  Poplt B Fib 2.1 10.0 48 >40  Poplt    14.6  4.1         Right Peroneal Motor (Ext Dig Brev)  32 C  Ankle    2.8 <6.0 5.1 >2.5 B  Fib Ankle 8.4 41.0 49 >40  B Fib    11.2  4.5  Poplt B Fib 1.9 10.0 53 >40  Poplt    13.1  4.4         Left Tibial Motor (Abd Hall Brev)  32 C  Ankle    3.9 <6.0 9.1 >4 Knee Ankle 9.9 45.0 45 >40  Knee    13.8  7.7         Right Tibial Motor (Abd Hall Brev)  32 C  Ankle    4.0 <6.0 8.5 >4 Knee Ankle 9.1 45.0 49 >40  Knee    13.1  8.4          Electromyography   Side Muscle Ins.Act Fibs Fasc Recrt Amp Dur Poly Activation Comment  Right AntTibialis Nml Nml Nml Nml Nml Nml Nml Nml N/A  Right Gastroc Nml Nml Nml Nml Nml Nml Nml Nml N/A  Right Flex Dig Long Nml Nml Nml Nml Nml Nml Nml Nml N/A  Right RectFemoris Nml Nml Nml Nml Nml Nml Nml Nml N/A  Right BicepsFemS Nml Nml Nml Nml Nml Nml Nml Nml N/A  Left AntTibialis Nml Nml Nml Nml Nml Nml Nml Nml N/A  Left Gastroc Nml Nml Nml Nml Nml Nml Nml Nml N/A  Left Flex Dig Long Nml Nml Nml Nml Nml Nml Nml Nml N/A  Left RectFemoris Nml Nml Nml Nml Nml Nml Nml Nml N/A  Left BicepsFemS Nml Nml Nml Nml Nml Nml Nml Nml N/A      Waveforms:

## 2023-12-18 DIAGNOSIS — G609 Hereditary and idiopathic neuropathy, unspecified: Secondary | ICD-10-CM | POA: Diagnosis not present

## 2023-12-19 ENCOUNTER — Other Ambulatory Visit (HOSPITAL_COMMUNITY): Payer: Self-pay

## 2024-02-20 ENCOUNTER — Other Ambulatory Visit (HOSPITAL_COMMUNITY): Payer: Self-pay

## 2024-02-20 MED ORDER — LISINOPRIL-HYDROCHLOROTHIAZIDE 20-12.5 MG PO TABS
1.0000 | ORAL_TABLET | Freq: Every day | ORAL | 4 refills | Status: AC
  Filled 2024-02-20: qty 90, 90d supply, fill #0
  Filled 2024-05-23: qty 90, 90d supply, fill #1

## 2024-03-26 DIAGNOSIS — R7303 Prediabetes: Secondary | ICD-10-CM | POA: Diagnosis not present

## 2024-03-26 DIAGNOSIS — Z0000 Encounter for general adult medical examination without abnormal findings: Secondary | ICD-10-CM | POA: Diagnosis not present

## 2024-03-26 DIAGNOSIS — N1831 Chronic kidney disease, stage 3a: Secondary | ICD-10-CM | POA: Diagnosis not present

## 2024-03-26 DIAGNOSIS — I10 Essential (primary) hypertension: Secondary | ICD-10-CM | POA: Diagnosis not present

## 2024-03-26 DIAGNOSIS — N400 Enlarged prostate without lower urinary tract symptoms: Secondary | ICD-10-CM | POA: Diagnosis not present

## 2024-03-26 DIAGNOSIS — Z1389 Encounter for screening for other disorder: Secondary | ICD-10-CM | POA: Diagnosis not present

## 2024-03-26 DIAGNOSIS — Z08 Encounter for follow-up examination after completed treatment for malignant neoplasm: Secondary | ICD-10-CM | POA: Diagnosis not present

## 2024-03-26 DIAGNOSIS — Z8546 Personal history of malignant neoplasm of prostate: Secondary | ICD-10-CM | POA: Diagnosis not present

## 2024-03-26 DIAGNOSIS — J309 Allergic rhinitis, unspecified: Secondary | ICD-10-CM | POA: Diagnosis not present

## 2024-03-26 DIAGNOSIS — E7800 Pure hypercholesterolemia, unspecified: Secondary | ICD-10-CM | POA: Diagnosis not present

## 2024-04-02 DIAGNOSIS — N1831 Chronic kidney disease, stage 3a: Secondary | ICD-10-CM | POA: Diagnosis not present

## 2024-04-02 DIAGNOSIS — I10 Essential (primary) hypertension: Secondary | ICD-10-CM | POA: Diagnosis not present

## 2024-04-02 DIAGNOSIS — N182 Chronic kidney disease, stage 2 (mild): Secondary | ICD-10-CM | POA: Diagnosis not present

## 2024-04-15 DIAGNOSIS — I10 Essential (primary) hypertension: Secondary | ICD-10-CM | POA: Diagnosis not present

## 2024-04-15 DIAGNOSIS — N1831 Chronic kidney disease, stage 3a: Secondary | ICD-10-CM | POA: Diagnosis not present

## 2024-05-01 DIAGNOSIS — N182 Chronic kidney disease, stage 2 (mild): Secondary | ICD-10-CM | POA: Diagnosis not present

## 2024-05-01 DIAGNOSIS — N1831 Chronic kidney disease, stage 3a: Secondary | ICD-10-CM | POA: Diagnosis not present

## 2024-05-01 DIAGNOSIS — I10 Essential (primary) hypertension: Secondary | ICD-10-CM | POA: Diagnosis not present

## 2024-05-09 DIAGNOSIS — N182 Chronic kidney disease, stage 2 (mild): Secondary | ICD-10-CM | POA: Diagnosis not present

## 2024-05-09 DIAGNOSIS — N400 Enlarged prostate without lower urinary tract symptoms: Secondary | ICD-10-CM | POA: Diagnosis not present

## 2024-05-09 DIAGNOSIS — E7800 Pure hypercholesterolemia, unspecified: Secondary | ICD-10-CM | POA: Diagnosis not present

## 2024-05-09 DIAGNOSIS — N1831 Chronic kidney disease, stage 3a: Secondary | ICD-10-CM | POA: Diagnosis not present

## 2024-05-09 DIAGNOSIS — I10 Essential (primary) hypertension: Secondary | ICD-10-CM | POA: Diagnosis not present

## 2024-05-09 DIAGNOSIS — M179 Osteoarthritis of knee, unspecified: Secondary | ICD-10-CM | POA: Diagnosis not present

## 2024-05-23 ENCOUNTER — Other Ambulatory Visit: Payer: Self-pay

## 2024-05-31 DIAGNOSIS — I10 Essential (primary) hypertension: Secondary | ICD-10-CM | POA: Diagnosis not present

## 2024-05-31 DIAGNOSIS — N1831 Chronic kidney disease, stage 3a: Secondary | ICD-10-CM | POA: Diagnosis not present

## 2024-05-31 DIAGNOSIS — N182 Chronic kidney disease, stage 2 (mild): Secondary | ICD-10-CM | POA: Diagnosis not present

## 2024-06-03 ENCOUNTER — Other Ambulatory Visit (HOSPITAL_COMMUNITY): Payer: Self-pay

## 2024-06-03 DIAGNOSIS — B349 Viral infection, unspecified: Secondary | ICD-10-CM | POA: Diagnosis not present

## 2024-06-03 DIAGNOSIS — R059 Cough, unspecified: Secondary | ICD-10-CM | POA: Diagnosis not present

## 2024-06-03 DIAGNOSIS — R058 Other specified cough: Secondary | ICD-10-CM | POA: Diagnosis not present

## 2024-06-03 MED ORDER — HYDROCODONE BIT-HOMATROP MBR 5-1.5 MG/5ML PO SOLN
5.0000 mL | Freq: Every evening | ORAL | 0 refills | Status: AC | PRN
  Filled 2024-06-03: qty 100, 20d supply, fill #0

## 2024-06-09 DIAGNOSIS — M179 Osteoarthritis of knee, unspecified: Secondary | ICD-10-CM | POA: Diagnosis not present

## 2024-06-09 DIAGNOSIS — N182 Chronic kidney disease, stage 2 (mild): Secondary | ICD-10-CM | POA: Diagnosis not present

## 2024-06-09 DIAGNOSIS — N400 Enlarged prostate without lower urinary tract symptoms: Secondary | ICD-10-CM | POA: Diagnosis not present

## 2024-06-09 DIAGNOSIS — E7800 Pure hypercholesterolemia, unspecified: Secondary | ICD-10-CM | POA: Diagnosis not present

## 2024-06-30 DIAGNOSIS — N1831 Chronic kidney disease, stage 3a: Secondary | ICD-10-CM | POA: Diagnosis not present

## 2024-06-30 DIAGNOSIS — N182 Chronic kidney disease, stage 2 (mild): Secondary | ICD-10-CM | POA: Diagnosis not present

## 2024-06-30 DIAGNOSIS — I10 Essential (primary) hypertension: Secondary | ICD-10-CM | POA: Diagnosis not present

## 2024-07-09 DIAGNOSIS — N400 Enlarged prostate without lower urinary tract symptoms: Secondary | ICD-10-CM | POA: Diagnosis not present

## 2024-07-09 DIAGNOSIS — N1831 Chronic kidney disease, stage 3a: Secondary | ICD-10-CM | POA: Diagnosis not present

## 2024-07-09 DIAGNOSIS — E7800 Pure hypercholesterolemia, unspecified: Secondary | ICD-10-CM | POA: Diagnosis not present

## 2024-07-09 DIAGNOSIS — N182 Chronic kidney disease, stage 2 (mild): Secondary | ICD-10-CM | POA: Diagnosis not present

## 2024-07-09 DIAGNOSIS — I10 Essential (primary) hypertension: Secondary | ICD-10-CM | POA: Diagnosis not present

## 2024-07-09 DIAGNOSIS — M179 Osteoarthritis of knee, unspecified: Secondary | ICD-10-CM | POA: Diagnosis not present

## 2024-07-24 DIAGNOSIS — Z23 Encounter for immunization: Secondary | ICD-10-CM | POA: Diagnosis not present

## 2024-07-30 DIAGNOSIS — I10 Essential (primary) hypertension: Secondary | ICD-10-CM | POA: Diagnosis not present

## 2024-07-30 DIAGNOSIS — N182 Chronic kidney disease, stage 2 (mild): Secondary | ICD-10-CM | POA: Diagnosis not present

## 2024-07-30 DIAGNOSIS — N1831 Chronic kidney disease, stage 3a: Secondary | ICD-10-CM | POA: Diagnosis not present

## 2024-08-09 DIAGNOSIS — E7800 Pure hypercholesterolemia, unspecified: Secondary | ICD-10-CM | POA: Diagnosis not present

## 2024-08-09 DIAGNOSIS — N182 Chronic kidney disease, stage 2 (mild): Secondary | ICD-10-CM | POA: Diagnosis not present

## 2024-08-09 DIAGNOSIS — N400 Enlarged prostate without lower urinary tract symptoms: Secondary | ICD-10-CM | POA: Diagnosis not present

## 2024-08-09 DIAGNOSIS — M179 Osteoarthritis of knee, unspecified: Secondary | ICD-10-CM | POA: Diagnosis not present

## 2024-08-09 DIAGNOSIS — N1831 Chronic kidney disease, stage 3a: Secondary | ICD-10-CM | POA: Diagnosis not present

## 2024-08-09 DIAGNOSIS — I10 Essential (primary) hypertension: Secondary | ICD-10-CM | POA: Diagnosis not present

## 2024-08-29 DIAGNOSIS — N182 Chronic kidney disease, stage 2 (mild): Secondary | ICD-10-CM | POA: Diagnosis not present

## 2024-08-29 DIAGNOSIS — I10 Essential (primary) hypertension: Secondary | ICD-10-CM | POA: Diagnosis not present

## 2024-08-29 DIAGNOSIS — N1831 Chronic kidney disease, stage 3a: Secondary | ICD-10-CM | POA: Diagnosis not present

## 2024-09-08 DIAGNOSIS — I10 Essential (primary) hypertension: Secondary | ICD-10-CM | POA: Diagnosis not present

## 2024-09-08 DIAGNOSIS — N1831 Chronic kidney disease, stage 3a: Secondary | ICD-10-CM | POA: Diagnosis not present

## 2024-09-08 DIAGNOSIS — N182 Chronic kidney disease, stage 2 (mild): Secondary | ICD-10-CM | POA: Diagnosis not present

## 2024-09-08 DIAGNOSIS — E7800 Pure hypercholesterolemia, unspecified: Secondary | ICD-10-CM | POA: Diagnosis not present

## 2024-09-08 DIAGNOSIS — M179 Osteoarthritis of knee, unspecified: Secondary | ICD-10-CM | POA: Diagnosis not present

## 2024-09-08 DIAGNOSIS — N400 Enlarged prostate without lower urinary tract symptoms: Secondary | ICD-10-CM | POA: Diagnosis not present

## 2024-09-24 ENCOUNTER — Other Ambulatory Visit (HOSPITAL_COMMUNITY): Payer: Self-pay

## 2024-09-24 MED ORDER — AMLODIPINE BESYLATE 10 MG PO TABS
10.0000 mg | ORAL_TABLET | Freq: Every day | ORAL | 3 refills | Status: AC
  Filled 2024-09-24: qty 90, 90d supply, fill #0

## 2024-09-26 ENCOUNTER — Other Ambulatory Visit (HOSPITAL_COMMUNITY): Payer: Self-pay

## 2024-09-26 MED ORDER — AMLODIPINE BESYLATE 10 MG PO TABS
10.0000 mg | ORAL_TABLET | Freq: Every day | ORAL | 3 refills | Status: AC
  Filled 2024-09-26: qty 90, 90d supply, fill #0
